# Patient Record
Sex: Female | Born: 1980 | Race: White | Hispanic: No | Smoking: Former smoker
Health system: Southern US, Community
[De-identification: ages and names within clinical notes are randomized; demographics above are authoritative.]

## PROBLEM LIST (undated history)

## (undated) DIAGNOSIS — F419 Anxiety disorder, unspecified: Secondary | ICD-10-CM

## (undated) DIAGNOSIS — I7300 Raynaud's syndrome without gangrene: Secondary | ICD-10-CM

## (undated) DIAGNOSIS — Z87898 Personal history of other specified conditions: Secondary | ICD-10-CM

## (undated) HISTORY — DX: Anxiety disorder, unspecified: F41.9

## (undated) HISTORY — DX: Raynaud's syndrome without gangrene: I73.00

## (undated) HISTORY — DX: Personal history of other specified conditions: Z87.898

---

## 2010-05-28 ENCOUNTER — Ambulatory Visit: Payer: Self-pay | Admitting: Family Medicine

## 2010-05-28 DIAGNOSIS — L049 Acute lymphadenitis, unspecified: Secondary | ICD-10-CM | POA: Insufficient documentation

## 2010-05-28 DIAGNOSIS — F39 Unspecified mood [affective] disorder: Secondary | ICD-10-CM | POA: Insufficient documentation

## 2010-05-29 LAB — CONVERTED CEMR LAB
Basophils Relative: 0.7 % (ref 0.0–3.0)
CO2: 27 meq/L (ref 19–32)
Chloride: 105 meq/L (ref 96–112)
Eosinophils Relative: 1.9 % (ref 0.0–5.0)
Folate: 7.5 ng/mL
Glucose, Bld: 74 mg/dL (ref 70–99)
Hemoglobin: 13.9 g/dL (ref 12.0–15.0)
Lymphocytes Relative: 26.7 % (ref 12.0–46.0)
MCV: 90.4 fL (ref 78.0–100.0)
Neutro Abs: 4.2 10*3/uL (ref 1.4–7.7)
Neutrophils Relative %: 63.7 % (ref 43.0–77.0)
RBC: 4.49 M/uL (ref 3.87–5.11)
Sodium: 139 meq/L (ref 135–145)
TSH: 1.8 microintl units/mL (ref 0.35–5.50)
WBC: 6.6 10*3/uL (ref 4.5–10.5)

## 2010-06-18 ENCOUNTER — Ambulatory Visit: Payer: Self-pay | Admitting: Family Medicine

## 2010-10-13 NOTE — Assessment & Plan Note (Signed)
Summary: NEW TO EST/KN   Vital Signs:  Patient profile:   30 year old female Height:      62.25 inches Weight:      144 pounds Pulse rate:   80 / minute Resp:     20 per minute BP sitting:   110 / 90  (left arm)  Vitals Entered By: Jeremy Johann CMA (May 28, 2010 10:17 AM) CC: NEW TO ESTABLISH, DISCUSS MOOD SWINGS, TENDERNESS UNDER LEFT ARM   History of Present Illness: 30 yo woman here today to establish care.  previous MD- Raymondo Band at [facility].  GYN- [facility]  1) mood swings- sxs started a few months ago.  will get really angy and then get depressed about her anger.  'will feel this way for a few weeks and then it goes away'.  pt describes herself as 'a typically shy person' but there will be times when she's 'super talkative, outgoing, really good mood'.  has hx of spending more than she has, drinking too much, previously promiscuious.  + family hx of 'manic depressive'- dad.  uncle w/ schizophrenia.  2) Tenderness under L arm- sxs started 'a couple of months ago'.  pain w/ pressing upper arm against chest wall.  armpit appears 'swollen' to pt when compared to R side.  no palpable lump, drainage, redness.  no pain w/ moving arms or PE activity.  pain w/ touching area.  Preventive Screening-Counseling & Management  Alcohol-Tobacco     Alcohol drinks/day: <1     Smoking Status: quit < 6 months     Year Quit: 2011     Pack years: 7  Caffeine-Diet-Exercise     Does Patient Exercise: yes      Sexual History:  currently monogamous.        Drug Use:  never and no.    Current Medications (verified): 1)  None  Allergies (verified): No Known Drug Allergies  Past History:  Past Medical History: abnormal pap  Past Surgical History: NONE  Family History: DM-FATHER HTN-MOTHER, FATHER Breast CA- none Colon CA- none  Social History: Married no children dog and cat Never Smoked Alcohol use-yes Drug use-no Regular exercise-yes Smoking Status:  quit < 6  months Drug Use:  never, no Does Patient Exercise:  yes Sexual History:  currently monogamous  Review of Systems      See HPI  Physical Exam  General:  Well-developed,well-nourished,in no acute distress; alert,appropriate and cooperative throughout examination Head:  Normocephalic and atraumatic without obvious abnormalities. No apparent alopecia or balding. Neck:  No deformities, masses, or tenderness noted. Lungs:  Normal respiratory effort, chest expands symmetrically. Lungs are clear to auscultation, no crackles or wheezes. Heart:  Normal rate and regular rhythm. S1 and S2 normal without gallop, murmur, click, rub or other extra sounds. Cervical Nodes:  No lymphadenopathy noted Axillary Nodes:  L axillary LN enlarged and L axillary LN tender.   Inguinal Nodes:  No significant adenopathy Psych:  Cognition and judgment appear intact. Alert and cooperative with normal attention span and concentration. No apparent delusions, illusions, hallucinations   Impression & Recommendations:  Problem # 1:  MOOD SWINGS (ICD-296.99) Assessment New ? bipolar disorder given pt's reported depression alternating w/ elevated mood.  will r/o thyroid disorder and electrolyte abnormality.  will refer pt to psych for complete evaluation and possible med initiation Orders: Venipuncture (16109) Specimen Handling (60454) TLB-TSH (Thyroid Stimulating Hormone) (84443-TSH) TLB-B12 + Folate Pnl (09811_91478-G95/AOZ) TLB-BMP (Basic Metabolic Panel-BMET) (80048-METABOL) Psychiatric Referral (Psych)  Problem # 2:  ACUTE LYMPHADENITIS (ICD-683) Assessment: New pt w/ tender, enlarged LN in L axilla.  no other LAD.  check CBC.  start Clinda.  if no improvement in a few weeks will [place] Orders: Specimen Handling (29562) TLB-CBC Platelet - w/Differential (85025-CBCD)  Her updated medication list for this problem includes:    Clindamycin Hcl 300 Mg Caps (Clindamycin hcl) .Marland Kitchen... 1 tab by mouth three times a day x7  days  Complete Medication List: 1)  Clindamycin Hcl 300 Mg Caps (Clindamycin hcl) .Marland Kitchen.. 1 tab by mouth three times a day x7 days  Patient Instructions: 1)  Please schedule a follow-up appointment in 3-4 weeks to follow up on inflamed lymph node and mood swings. 2)  We'll give you the names and numbers of psychiatrists to call to set up an appt 3)  Start the Clindamycin as directed for the lymph node 4)  Apply hot compresses 5)  We'll notify you of your lab results 6)  Call with any questions or concerns 7)  Welcome!  We're glad to have you!  Prescriptions: CLINDAMYCIN HCL 300 MG CAPS (CLINDAMYCIN HCL) 1 tab by mouth three times a day x7 days  #21 x 0   Entered and Authorized by:   Neena Rhymes MD   Signed by:   Neena Rhymes MD on 05/28/2010   Method used:   Electronically to        [facility] [phone]* (retail)       [address]       [place], [place]  [address]       Ph: [phone]       Fax: [phone]   RxID:   [id number]

## 2010-10-13 NOTE — Assessment & Plan Note (Signed)
Summary: FOLLOW UP LYMPH NODES/RH......Marland Kitchen   Vital Signs:  Patient profile:   30 year old female Height:      62.25 inches Weight:      143 pounds BMI:     26.04 Pulse rate:   60 / minute BP sitting:   104 / 60  (left arm)  Vitals Entered By: Doristine Devoid CMA (June 18, 2010 9:24 AM) CC: f/u on lymph nodes    History of Present Illness: 30 yo woman here today for f/u on  1) mood swings- has upcoming appt w/ psych for Monday.  will be seeing [facility].  still having alternating highs and lows, looking forward to upcoming appt.  2) LAD- feels area has improved but not resolved.  less painful.  no drainage, redness or warmth to area.  still localized to L axilla.  no fevers, chills.  Current Medications (verified): 1)  None  Allergies (verified): No Known Drug Allergies  Review of Systems      See HPI  Physical Exam  General:  Well-developed,well-nourished,in no acute distress; alert,appropriate and cooperative throughout examination Axillary Nodes:  no palpable LAD, non tender   Impression & Recommendations:  Problem # 1:  ACUTE LYMPHADENITIS (ICD-683) Assessment Improved pt's sxs have resolved- no palpable node on PE. The following medications were removed from the medication list:    Clindamycin Hcl 300 Mg Caps (Clindamycin hcl) .Marland Kitchen... 1 tab by mouth three times a day x7 days  Problem # 2:  MOOD SWINGS (ICD-296.99) Assessment: Unchanged pt has appt on Monday w/ psych.  will follow.  Patient Instructions: 1)  Schedule your physical at your convenience- do not eat before this appt 2)  Your lymph node is now normal! 3)  Good luck on Monday- keep me updated! 4)  Have a great weekend!

## 2011-03-10 ENCOUNTER — Ambulatory Visit (INDEPENDENT_AMBULATORY_CARE_PROVIDER_SITE_OTHER): Payer: Managed Care, Other (non HMO) | Admitting: Family Medicine

## 2011-03-10 ENCOUNTER — Encounter: Payer: Self-pay | Admitting: Family Medicine

## 2011-03-10 DIAGNOSIS — Z789 Other specified health status: Secondary | ICD-10-CM | POA: Insufficient documentation

## 2011-03-10 DIAGNOSIS — Z0184 Encounter for antibody response examination: Secondary | ICD-10-CM | POA: Insufficient documentation

## 2011-03-10 MED ORDER — CIPROFLOXACIN HCL 500 MG PO TABS: 500.0000 mg | ORAL_TABLET | Freq: Two times a day (BID) | ORAL | Status: AC

## 2011-03-10 NOTE — Progress Notes (Signed)
  Subjective:    Patient ID: Shelby Morris, female    DOB: 1981/01/09, 30 y.o.   MRN: [id number]  HPI Going to [place] and [place] for work.  Has gone previously and was told to bring cipro 'just in case'.  Has titers showing Hep B immunity- no evidence of Hep A.   Review of Systems For ROS see HPI     Objective:   Physical Exam  Constitutional: She is oriented to person, place, and time. She appears well-developed and well-nourished. No distress.  Neurological: She is alert and oriented to person, place, and time. No cranial nerve deficit.  Psychiatric: She has a normal mood and affect. Her behavior is normal. Judgment and thought content normal.          Assessment & Plan:

## 2011-03-10 NOTE — Patient Instructions (Signed)
HAVE A GREAT TRIP!!!! We'll call you about the Hep A status If you decide you want Malarone- call me! Hopefully you won't need the Cipro! Call with any questions or concerns Enjoy!

## 2011-03-11 ENCOUNTER — Ambulatory Visit (INDEPENDENT_AMBULATORY_CARE_PROVIDER_SITE_OTHER): Payer: Managed Care, Other (non HMO) | Admitting: *Deleted

## 2011-03-11 DIAGNOSIS — Z23 Encounter for immunization: Secondary | ICD-10-CM

## 2011-03-11 DIAGNOSIS — Z0000 Encounter for general adult medical examination without abnormal findings: Secondary | ICD-10-CM

## 2011-03-23 NOTE — Assessment & Plan Note (Signed)
Pt immune to Hep B but isn't sure about Hep A.  Check titers.  Vaccinate if negative.

## 2011-03-23 NOTE — Assessment & Plan Note (Signed)
Reviewed CDC recommendations for travel to [place].  Malaria prophylaxis is recommended if traveling to rural areas.  Pt [place] she will be in urban areas.  Doesn't feel malarone is necessary at this time but will discuss it w/ people at work and get back to me if she is interested.  Script for cipro given in case of traveller's diarrhea.

## 2011-05-11 ENCOUNTER — Ambulatory Visit (INDEPENDENT_AMBULATORY_CARE_PROVIDER_SITE_OTHER): Payer: Managed Care, Other (non HMO) | Admitting: Family Medicine

## 2011-05-11 ENCOUNTER — Encounter: Payer: Self-pay | Admitting: Family Medicine

## 2011-05-11 DIAGNOSIS — Z0000 Encounter for general adult medical examination without abnormal findings: Secondary | ICD-10-CM | POA: Insufficient documentation

## 2011-05-11 LAB — CBC WITH DIFFERENTIAL/PLATELET
Basophils Relative: 0.6 % (ref 0.0–3.0)
Eosinophils Relative: 2.6 % (ref 0.0–5.0)
HCT: 41.3 % (ref 36.0–46.0)
Hemoglobin: 13.8 g/dL (ref 12.0–15.0)
Lymphs Abs: 1.6 10*3/uL (ref 0.7–4.0)
MCHC: 33.4 g/dL (ref 30.0–36.0)
MCV: 90.4 fl (ref 78.0–100.0)
Monocytes Absolute: 0.4 10*3/uL (ref 0.1–1.0)
Neutro Abs: 4.9 10*3/uL (ref 1.4–7.7)
Neutrophils Relative %: 69.4 % (ref 43.0–77.0)
RBC: 4.57 Mil/uL (ref 3.87–5.11)
WBC: 7.1 10*3/uL (ref 4.5–10.5)

## 2011-05-11 LAB — BASIC METABOLIC PANEL
BUN: 12 mg/dL (ref 6–23)
Creatinine, Ser: 0.7 mg/dL (ref 0.4–1.2)
GFR: 113.73 mL/min (ref >60.00)
Glucose, Bld: 82 mg/dL (ref 70–99)
Potassium: 3.9 mEq/L (ref 3.5–5.1)

## 2011-05-11 LAB — HEPATIC FUNCTION PANEL
AST: 15 U/L (ref 0–37)
Albumin: 4.3 g/dL (ref 3.5–5.2)

## 2011-05-11 LAB — TSH: TSH: 2.2 u[IU]/mL (ref 0.35–5.50)

## 2011-05-11 LAB — LIPID PANEL
Cholesterol: 148 mg/dL (ref 0–200)
HDL: 80.9 mg/dL (ref >39.00)

## 2011-05-11 NOTE — Patient Instructions (Signed)
We'll notify you of your lab results Keep up the good work on diet and exercise- you look great! STOP SMOKING!  You can do it! Call with any questions or concerns Have fun camping!!!

## 2011-05-11 NOTE — Assessment & Plan Note (Signed)
Pt's PE WNL.  Check labs.  UTD on GYN.  Encouraged pt to stop smoking.  Anticipatory guidance provided.

## 2011-05-11 NOTE — Progress Notes (Signed)
  Subjective:    Patient ID: Shelby Morris, female    DOB: 1981/07/22, 30 y.o.   MRN: [id number]  HPI CPE- no concerns today.  GYN- Dr Achilles Dunk.  Has not had cholesterol.   Review of Systems Patient reports no vision/ hearing changes, adenopathy,fever, weight change,  persistant/recurrent hoarseness , swallowing issues, chest pain, palpitations, edema, persistant/recurrent cough, hemoptysis, dyspnea (rest/exertional/paroxysmal nocturnal), gastrointestinal bleeding (melena, rectal bleeding), abdominal pain, significant heartburn, bowel changes, GU symptoms (dysuria, hematuria, incontinence), Gyn symptoms (abnormal  bleeding, pain),  syncope, focal weakness, memory loss, numbness & tingling, skin/hair/nail changes, abnormal bruising or bleeding, anxiety, or depression.     Objective:   Physical Exam  General Appearance:    Alert, cooperative, no distress, appears stated age  Head:    Normocephalic, without obvious abnormality, atraumatic  Eyes:    PERRL, conjunctiva/corneas clear, EOM's intact, fundi    benign, both eyes  Ears:    Normal TM's and external ear canals, both ears  Nose:   Nares normal, septum midline, mucosa normal, no drainage    or sinus tenderness  Throat:   Lips, mucosa, and tongue normal; teeth and gums normal  Neck:   Supple, symmetrical, trachea midline, no adenopathy;    Thyroid: no enlargement/tenderness/nodules  Back:     Symmetric, no curvature, ROM normal, no CVA tenderness  Lungs:     Clear to auscultation bilaterally, respirations unlabored  Chest Wall:    No tenderness or deformity   Heart:    Regular rate and rhythm, S1 and S2 normal, no murmur, rub   or gallop  Breast Exam:    Deferred to GYN  Abdomen:     Soft, non-tender, bowel sounds active all four quadrants,    no masses, no organomegaly  Genitalia:    Deferred to GYN  Rectal:    Extremities:   Extremities normal, atraumatic, no cyanosis or edema  Pulses:   2+ and symmetric all extremities  Skin:    Skin color, texture, turgor normal, no rashes or lesions  Lymph nodes:   Cervical, supraclavicular, and axillary nodes normal  Neurologic:   CNII-XII intact, normal strength, sensation and reflexes    throughout          Assessment & Plan:

## 2011-05-12 ENCOUNTER — Encounter: Payer: Self-pay | Admitting: *Deleted

## 2011-05-13 ENCOUNTER — Telehealth: Payer: Self-pay

## 2011-05-13 LAB — VITAMIN D 1,25 DIHYDROXY
Vitamin D 1, 25 (OH)2 Total: 66 pg/mL (ref 18–72)
Vitamin D3 1, 25 (OH)2: 66 pg/mL

## 2011-05-13 NOTE — Telephone Encounter (Signed)
Lab mailed

## 2011-05-13 NOTE — Telephone Encounter (Signed)
Message copied by Beverely Low on Thu May 13, 2011  2:48 PM ------      Message from: Sheliah Hatch      Created: Thu May 13, 2011  9:35 AM       Vit D normal

## 2011-12-13 ENCOUNTER — Ambulatory Visit: Payer: Managed Care, Other (non HMO)

## 2011-12-21 ENCOUNTER — Ambulatory Visit: Payer: Managed Care, Other (non HMO)

## 2011-12-28 ENCOUNTER — Ambulatory Visit (INDEPENDENT_AMBULATORY_CARE_PROVIDER_SITE_OTHER): Payer: Managed Care, Other (non HMO) | Admitting: *Deleted

## 2011-12-28 DIAGNOSIS — Z23 Encounter for immunization: Secondary | ICD-10-CM

## 2011-12-28 MED ORDER — TETANUS-DIPHTH-ACELL PERTUSSIS 5-2.5-18.5 LF-MCG/0.5 IM SUSP
0.5000 mL | Freq: Once | INTRAMUSCULAR | Status: AC
  Administered 2011-12-28: 0.5 mL via INTRAMUSCULAR

## 2012-10-12 ENCOUNTER — Encounter: Payer: Self-pay | Admitting: Family Medicine

## 2012-10-12 ENCOUNTER — Ambulatory Visit (INDEPENDENT_AMBULATORY_CARE_PROVIDER_SITE_OTHER): Payer: Managed Care, Other (non HMO) | Admitting: Family Medicine

## 2012-10-12 VITALS — BP 110/70 | HR 87 | Temp 98.0°F | Ht 63.0 in | Wt 160.6 lb

## 2012-10-12 DIAGNOSIS — F17200 Nicotine dependence, unspecified, uncomplicated: Secondary | ICD-10-CM

## 2012-10-12 DIAGNOSIS — Z720 Tobacco use: Secondary | ICD-10-CM | POA: Insufficient documentation

## 2012-10-12 MED ORDER — VARENICLINE TARTRATE 1 MG PO TABS: 1.0000 mg | ORAL_TABLET | Freq: Two times a day (BID) | ORAL | Status: DC

## 2012-10-12 MED ORDER — VARENICLINE TARTRATE 0.5 MG X 11 & 1 MG X 42 PO MISC: ORAL | Status: DC

## 2012-10-12 NOTE — Progress Notes (Signed)
  Subjective:    Patient ID: Shelby Morris, female    DOB: 01/19/1981, 32 y.o.   MRN: [id number]  HPI Tobacco use- currently smoking 10-15 cigarettes daily.  Smoking x10 yrs.  Wants to quit.  Husband is also a smoker.  Husband is willing to quit.  Has quit previously by going cold [place] but this only lasted 1 month.  Has used the gum and has attempted to wean down.  Tried E cigs w/out results.  Has multiple friends who have quit using Chantix.  No current depression/anxiety.   Review of Systems For ROS see HPI     Objective:   Physical Exam  Vitals reviewed. Constitutional: She is oriented to person, place, and time. She appears well-developed and well-nourished. No distress.  Neurological: She is alert and oriented to person, place, and time. No cranial nerve deficit. Coordination normal.  Psychiatric: She has a normal mood and affect. Her behavior is normal. Judgment and thought content normal.          Assessment & Plan:

## 2012-10-12 NOTE — Assessment & Plan Note (Signed)
New.  Pt now interested in quitting.  Will start Chantix.  Reviewed appropriate start date and cessation techniques.  Will follow.

## 2012-10-12 NOTE — Patient Instructions (Addendum)
Pick a quit date and start the Chantix 1 week prior Use the starter pack first then use the continuation packs Call with any questions or concerns You can totally do this!

## 2012-10-16 ENCOUNTER — Telehealth: Payer: Self-pay | Admitting: *Deleted

## 2012-10-16 NOTE — Telephone Encounter (Signed)
Spoke with Pt need to call to see if med can be covered. [phone] [id number]

## 2012-10-16 NOTE — Telephone Encounter (Signed)
Pt left VM stating that we need to contact her insurance so they can cover med. Left message to call office.

## 2012-11-01 NOTE — Telephone Encounter (Signed)
Form has been requested awaiting fax.

## 2012-11-14 NOTE — Telephone Encounter (Signed)
Prior Auth denied for the following reason. Your prescription calls for a medication that is excluded from coverage based on the terms of your benefit plan.  Coverage of the requested drug is contractually excluded from the [facility] pharmacy benefit and therefore we are unable to authorize coverage of the requested drug.  This denial decision was based upon the Exclusions and limitations described in the certificate of coverage.

## 2012-11-14 NOTE — Telephone Encounter (Signed)
If chantix is declined by insurance, the next option is wellbutrin.  Please ask pt if she is interested in trying this

## 2012-11-15 NOTE — Telephone Encounter (Signed)
Spoke with Pt who states that she actually paid for the first month out of pocket with discount card. Pt notes that she has stopped smoking so far so she does not need the Wellbutrin at this time, but will call back if in the future she needs med.

## 2013-01-29 ENCOUNTER — Ambulatory Visit (INDEPENDENT_AMBULATORY_CARE_PROVIDER_SITE_OTHER): Payer: Managed Care, Other (non HMO) | Admitting: Family Medicine

## 2013-01-29 ENCOUNTER — Encounter: Payer: Self-pay | Admitting: Family Medicine

## 2013-01-29 ENCOUNTER — Ambulatory Visit (HOSPITAL_BASED_OUTPATIENT_CLINIC_OR_DEPARTMENT_OTHER)
Admission: RE | Admit: 2013-01-29 | Discharge: 2013-01-29 | Disposition: A | Discharge status: Home / Self Care | Payer: Managed Care, Other (non HMO) | Source: Ambulatory Visit | Attending: Family Medicine | Admitting: Family Medicine

## 2013-01-29 VITALS — BP 110/70 | HR 74 | Temp 98.6°F | Ht 63.0 in | Wt 150.6 lb

## 2013-01-29 DIAGNOSIS — M25579 Pain in unspecified ankle and joints of unspecified foot: Secondary | ICD-10-CM | POA: Insufficient documentation

## 2013-01-29 DIAGNOSIS — M25571 Pain in right ankle and joints of right foot: Secondary | ICD-10-CM

## 2013-01-29 DIAGNOSIS — X58XXXA Exposure to other specified factors, initial encounter: Secondary | ICD-10-CM | POA: Insufficient documentation

## 2013-01-29 DIAGNOSIS — M25473 Effusion, unspecified ankle: Secondary | ICD-10-CM | POA: Insufficient documentation

## 2013-01-29 DIAGNOSIS — M25476 Effusion, unspecified foot: Secondary | ICD-10-CM | POA: Insufficient documentation

## 2013-01-29 MED ORDER — NAPROXEN 500 MG PO TABS: 500.0000 mg | ORAL_TABLET | Freq: Two times a day (BID) | ORAL | Status: AC

## 2013-01-29 NOTE — Assessment & Plan Note (Signed)
New.  Pt w/ impressive swelling and bruising of R ankle.  + TTP over medial malleolus.  Pain w/ weight bearing.  Will get Xray.  Pt placed in ASO.  Start scheduled NSAIDs.  If fx, refer to ortho.  Reviewed soft tissue damage and that it may take 6-8 weeks to heal from a severe sprain.  Will follow closely.  Pt expressed understanding and is in agreement w/ plan.

## 2013-01-29 NOTE — Patient Instructions (Signed)
Go to the [facility] on [address] and get your [employer] call you with your results Continue to ICE! Start the Naproxen twice daily- take w/ food Call with any questions or concerns Hang in there!

## 2013-01-29 NOTE — Progress Notes (Signed)
  Subjective:    Patient ID: Shelby Morris, female    DOB: March 02, 1981, 32 y.o.   MRN: [id number]  HPI Ankle pain- R ankle, rolled when going up the stairs.  Pt thinks she might have inverted ankle but 'it happened so quickly it's hard to tell'.  Swollen, mild bruising.  Able to put some pressure on it today, was unable yesterday.  Has been icing and elevating, wrapped w/ ACE.     Review of Systems For ROS see HPI     Objective:   Physical Exam  Vitals reviewed. Constitutional: She is oriented to person, place, and time. She appears well-developed and well-nourished. No distress.  Cardiovascular: Intact distal pulses.   Musculoskeletal:       Right ankle: She exhibits decreased range of motion (pain w/ dorsiflexion > plantar flexion), swelling and ecchymosis. She exhibits normal pulse. Tenderness. Medial malleolus tenderness found. No AITFL, no CF ligament, no posterior TFL, no head of 5th metatarsal and no proximal fibula tenderness found.  Neurological: She is alert and oriented to person, place, and time.  Skin: Skin is warm and dry.  Bruising of R ankle          Assessment & Plan:

## 2013-09-10 ENCOUNTER — Telehealth: Payer: Self-pay | Admitting: *Deleted

## 2013-09-10 MED ORDER — CIPROFLOXACIN HCL 500 MG PO TABS: 500.0000 mg | ORAL_TABLET | Freq: Two times a day (BID) | ORAL | Status: DC

## 2013-09-10 NOTE — Telephone Encounter (Signed)
Patient called and stated that dr Beverely Low wrote her a generic antibiotic for traveling oversees and wanted to see could she get it again without being seen.    Pharmacy [facility] [address] [place]

## 2013-09-10 NOTE — Telephone Encounter (Signed)
Ok for Cipro 500mg  BID, #20, no refills

## 2013-09-10 NOTE — Telephone Encounter (Signed)
Med filled and pt notified.  

## 2018-04-03 DIAGNOSIS — R140 Abdominal distension (gaseous): Secondary | ICD-10-CM | POA: Diagnosis not present

## 2018-04-03 DIAGNOSIS — E559 Vitamin D deficiency, unspecified: Secondary | ICD-10-CM | POA: Diagnosis not present

## 2018-04-03 DIAGNOSIS — R5383 Other fatigue: Secondary | ICD-10-CM | POA: Diagnosis not present

## 2018-04-03 DIAGNOSIS — F419 Anxiety disorder, unspecified: Secondary | ICD-10-CM | POA: Diagnosis not present

## 2018-04-03 DIAGNOSIS — R197 Diarrhea, unspecified: Secondary | ICD-10-CM | POA: Diagnosis not present

## 2018-04-17 DIAGNOSIS — R197 Diarrhea, unspecified: Secondary | ICD-10-CM | POA: Diagnosis not present

## 2018-04-17 DIAGNOSIS — R5383 Other fatigue: Secondary | ICD-10-CM | POA: Diagnosis not present

## 2018-04-17 DIAGNOSIS — R140 Abdominal distension (gaseous): Secondary | ICD-10-CM | POA: Diagnosis not present

## 2018-04-17 DIAGNOSIS — E538 Deficiency of other specified B group vitamins: Secondary | ICD-10-CM | POA: Diagnosis not present

## 2019-03-12 ENCOUNTER — Other Ambulatory Visit: Payer: Self-pay

## 2019-03-12 ENCOUNTER — Encounter: Payer: Self-pay | Admitting: *Deleted

## 2019-03-12 ENCOUNTER — Emergency Department
Admission: EM | Admit: 2019-03-12 | Discharge: 2019-03-12 | Disposition: A | Discharge status: Home / Self Care | Payer: Self-pay | Source: Home / Self Care | Attending: Family Medicine | Admitting: Family Medicine

## 2019-03-12 DIAGNOSIS — J069 Acute upper respiratory infection, unspecified: Secondary | ICD-10-CM

## 2019-03-12 DIAGNOSIS — J029 Acute pharyngitis, unspecified: Secondary | ICD-10-CM

## 2019-03-12 NOTE — ED Triage Notes (Signed)
Pt c/o sore throat and HA x last night. Denies fever, cough or SOB. Denies known COVID exposure. Reports 38yo daughter has a runny nose.

## 2019-03-12 NOTE — ED Provider Notes (Signed)
[facility]    CSN: [id number] Arrival date & time: 03/12/19  1020     History   Chief Complaint Chief Complaint  Patient presents with  . Sore Throat    HPI Shelby Morris is a 38 y.o. female.   Patient developed a mild sore throat last night, and awoke today with persistent sore throat and nasal congestion.  She denies change in taste/smell.  She denies cough, myalgias, and fevers, chills, and sweats.  She has seasonal rhinitis, not normally active this time of year. Her 77 year old daughter has just developed a stuffy nose.  The history is provided by the patient.    Past Medical History:  Diagnosis Date  . History of abnormal Pap smear     Patient Active Problem List   Diagnosis Date Noted  . Ankle pain 01/29/2013  . Tobacco use 10/12/2012  . General medical examination 05/11/2011  . Immunity status testing 03/10/2011  . Foreign travel 03/10/2011  . MOOD SWINGS 05/28/2010  . ACUTE LYMPHADENITIS 05/28/2010    History reviewed. No pertinent surgical history.  OB History   No obstetric history on file.      Home Medications    Prior to Admission medications   Medication Sig Start Date End Date Taking? Authorizing Provider  citalopram (CELEXA) 20 MG tablet Take 20 mg by mouth daily.   Yes [provider]    Family History Family History  Problem Relation Age of Onset  . Diabetes Father   . Hypertension Father   . Hypertension Mother     Social History Social History   Tobacco Use  . Smoking status: Former Smoker    Packs/day: 0.50    Quit date: 10/01/2012    Years since quitting: 6.4  . Smokeless tobacco: Never Used  Substance Use Topics  . Alcohol use: No  . Drug use: No     Allergies   Flagyl [metronidazole]   Review of Systems Review of Systems + sore throat No cough No pleuritic pain No wheezing + nasal congestion ? post-nasal drainage No sinus pain/pressure No itchy/red eyes No earache No hemoptysis  No SOB No fever/chills No nausea No vomiting No abdominal pain No diarrhea No urinary symptoms No skin rash + fatigue No myalgias + headache    Physical Exam Triage Vital Signs ED Triage Vitals  Enc Vitals Group     BP 03/12/19 1048 (!) 130/91     Pulse Rate 03/12/19 1048 90     Resp 03/12/19 1048 16     Temp 03/12/19 1048 98.4 F (36.9 C)     Temp Source 03/12/19 1048 Oral     SpO2 03/12/19 1048 100 %     Weight 03/12/19 1049 160 lb (72.6 kg)     Height 03/12/19 1049 5\' 2"  (1.575 m)     Head Circumference --      Peak Flow --      Pain Score 03/12/19 1049 0     Pain Loc --      Pain Edu? --      Excl. in [place]? --    No data found.  Updated Vital Signs BP (!) 130/91 (BP Location: Right Arm)   Pulse 90   Temp 98.4 F (36.9 C) (Oral)   Resp 16   Ht 5\' 2"  (1.575 m)   Wt 72.6 kg   LMP 03/05/2019   SpO2 100%   BMI 29.26 kg/m   Visual Acuity Right Eye Distance:  Left Eye Distance:   Bilateral Distance:    Right Eye Near:   Left Eye Near:    Bilateral Near:     Physical Exam Nursing notes and Vital Signs reviewed. Appearance:  Patient appears stated age, and in no acute distress Eyes:  Pupils are equal, round, and reactive to light and accomodation.  Extraocular movement is intact.  Conjunctivae are not inflamed  Ears:  Canals normal.  Tympanic membranes normal.  Nose:  Mildly congested turbinates.  No sinus tenderness.  Pharynx:  Minimal erythema Neck:  Supple.  Enlarged posterior/lateral nodes are palpated bilaterally, tender to palpation on the left.   Lungs:  Clear to auscultation.  Breath sounds are equal.  Moving air well. Heart:  Regular rate and rhythm without murmurs, rubs, or gallops.  Abdomen:  Nontender without masses or hepatosplenomegaly.  Bowel sounds are present.  No CVA or flank tenderness.  Extremities:  No edema.  Skin:  No rash present.    UC Treatments / Results  Labs (all labs ordered are listed, but only abnormal results are  displayed) Labs Reviewed  STREP A DNA PROBE  POCT RAPID STREP A (OFFICE) negative    EKG None  Radiology No results found.  Procedures Procedures (including critical care time)  Medications Ordered in UC Medications - No data to display  Initial Impression / Assessment and Plan / UC Course  I have reviewed the triage vital signs and the nursing notes.  Pertinent labs & imaging results that were available during my care of the patient were reviewed by me and considered in my medical decision making (see chart for details).    Throat culture pending. Suspect early viral URI.  There is no evidence of bacterial infection today.   Treat symptomatically for now  Followup with Family Doctor if not improved in about 10 days.   Final Clinical Impressions(s) / UC Diagnoses   Final diagnoses:  Acute pharyngitis, unspecified etiology  Viral URI     Discharge Instructions     Take plain guaifenesin (1200mg  extended release tabs such as Mucinex) twice daily, with plenty of water, for cough and congestion.  May add Pseudoephedrine (30mg , one or two every 4 to 6 hours) for sinus congestion.  Get adequate rest.   May use Afrin nasal spray (or generic oxymetazoline) each morning for about 5 days and then discontinue.  Also recommend using saline nasal spray several times daily and saline nasal irrigation (AYR is a common brand).  Use Flonase nasal spray each morning after using Afrin nasal spray and saline nasal irrigation. Try warm salt water gargles for sore throat.  Stop all antihistamines for now, and other non-prescription cough/cold preparations. May take Ibuprofen 200mg , 4 tabs every 8 hours with food for sore throat, headache, etc. May take Delsym Cough Suppressant at bedtime for nighttime cough.   If symptoms become significantly worse during the night or over the weekend, proceed to the local emergency room.     ED Prescriptions    None         Lattie HawBeese, Tawan Corkern A, MD  03/12/19 1118

## 2019-03-12 NOTE — Discharge Instructions (Addendum)
Take plain guaifenesin (1200mg  extended release tabs such as Mucinex) twice daily, with plenty of water, for cough and congestion.  May add Pseudoephedrine (30mg , one or two every 4 to 6 hours) for sinus congestion.  Get adequate rest.   May use Afrin nasal spray (or generic oxymetazoline) each morning for about 5 days and then discontinue.  Also recommend using saline nasal spray several times daily and saline nasal irrigation (AYR is a common brand).  Use Flonase nasal spray each morning after using Afrin nasal spray and saline nasal irrigation. Try warm salt water gargles for sore throat.  Stop all antihistamines for now, and other non-prescription cough/cold preparations. May take Ibuprofen 200mg , 4 tabs every 8 hours with food for sore throat, headache, etc. May take Delsym Cough Suppressant at bedtime for nighttime cough.   If symptoms become significantly worse during the night or over the weekend, proceed to the local emergency room.

## 2019-03-13 ENCOUNTER — Telehealth: Payer: Self-pay

## 2019-03-13 LAB — STREP A DNA PROBE: Group A Strep Probe: NOT DETECTED

## 2019-03-13 NOTE — Telephone Encounter (Signed)
Pt states she is feeling better since UC visit. Taking meds as prescribed. Given neg TCX results.

## 2019-09-27 DIAGNOSIS — F411 Generalized anxiety disorder: Secondary | ICD-10-CM | POA: Diagnosis not present

## 2019-10-08 DIAGNOSIS — F901 Attention-deficit hyperactivity disorder, predominantly hyperactive type: Secondary | ICD-10-CM | POA: Diagnosis not present

## 2019-10-08 DIAGNOSIS — F411 Generalized anxiety disorder: Secondary | ICD-10-CM | POA: Diagnosis not present

## 2019-10-08 DIAGNOSIS — F1010 Alcohol abuse, uncomplicated: Secondary | ICD-10-CM | POA: Diagnosis not present

## 2019-10-22 DIAGNOSIS — F1010 Alcohol abuse, uncomplicated: Secondary | ICD-10-CM | POA: Diagnosis not present

## 2019-10-22 DIAGNOSIS — F901 Attention-deficit hyperactivity disorder, predominantly hyperactive type: Secondary | ICD-10-CM | POA: Diagnosis not present

## 2019-10-22 DIAGNOSIS — F411 Generalized anxiety disorder: Secondary | ICD-10-CM | POA: Diagnosis not present

## 2019-11-05 DIAGNOSIS — F901 Attention-deficit hyperactivity disorder, predominantly hyperactive type: Secondary | ICD-10-CM | POA: Diagnosis not present

## 2019-11-05 DIAGNOSIS — F411 Generalized anxiety disorder: Secondary | ICD-10-CM | POA: Diagnosis not present

## 2019-11-05 DIAGNOSIS — F1010 Alcohol abuse, uncomplicated: Secondary | ICD-10-CM | POA: Diagnosis not present

## 2019-11-19 DIAGNOSIS — F1010 Alcohol abuse, uncomplicated: Secondary | ICD-10-CM | POA: Diagnosis not present

## 2019-11-19 DIAGNOSIS — F901 Attention-deficit hyperactivity disorder, predominantly hyperactive type: Secondary | ICD-10-CM | POA: Diagnosis not present

## 2019-11-19 DIAGNOSIS — F411 Generalized anxiety disorder: Secondary | ICD-10-CM | POA: Diagnosis not present

## 2019-11-30 DIAGNOSIS — Z23 Encounter for immunization: Secondary | ICD-10-CM | POA: Diagnosis not present

## 2019-12-13 DIAGNOSIS — G44219 Episodic tension-type headache, not intractable: Secondary | ICD-10-CM | POA: Diagnosis not present

## 2019-12-13 DIAGNOSIS — R5383 Other fatigue: Secondary | ICD-10-CM | POA: Diagnosis not present

## 2019-12-13 DIAGNOSIS — F411 Generalized anxiety disorder: Secondary | ICD-10-CM | POA: Diagnosis not present

## 2019-12-13 DIAGNOSIS — Z0000 Encounter for general adult medical examination without abnormal findings: Secondary | ICD-10-CM | POA: Diagnosis not present

## 2019-12-13 DIAGNOSIS — Z13220 Encounter for screening for lipoid disorders: Secondary | ICD-10-CM | POA: Diagnosis not present

## 2019-12-13 DIAGNOSIS — E559 Vitamin D deficiency, unspecified: Secondary | ICD-10-CM | POA: Diagnosis not present

## 2019-12-13 LAB — CBC AND DIFFERENTIAL
HCT: 38 (ref 36–46)
Hemoglobin: 13 (ref 12.0–16.0)
Platelets: 267 10*3/uL (ref 150–400)
WBC: 7.7

## 2019-12-13 LAB — BASIC METABOLIC PANEL
BUN: 17 (ref 4–21)
CO2: 28 — AB (ref 13–22)
Chloride: 103 (ref 99–108)
Creatinine: 0.8 (ref 0.5–1.1)
Glucose: 90
Potassium: 4.1 meq/L (ref 3.5–5.1)
Sodium: 138 (ref 137–147)

## 2019-12-13 LAB — LIPID PANEL
Cholesterol: 163 (ref 0–200)
HDL: 48 (ref 35–70)
LDL Cholesterol: 90
LDl/HDL Ratio: 3.4
Triglycerides: 145 (ref 40–160)

## 2019-12-13 LAB — CBC: RBC: 4.22 (ref 3.87–5.11)

## 2019-12-13 LAB — HEPATIC FUNCTION PANEL
ALT: 14 U/L (ref 7–35)
AST: 14 (ref 13–35)
Alkaline Phosphatase: 40 (ref 25–125)
Bilirubin, Total: 0.2

## 2019-12-13 LAB — COMPREHENSIVE METABOLIC PANEL
Albumin: 4.2 (ref 3.5–5.0)
Calcium: 9.1 (ref 8.7–10.7)
eGFR: 76

## 2019-12-13 LAB — TSH: TSH: 2.48 (ref 0.41–5.90)

## 2019-12-13 LAB — VITAMIN D 25 HYDROXY (VIT D DEFICIENCY, FRACTURES): Vit D, 25-Hydroxy: 26

## 2019-12-17 ENCOUNTER — Other Ambulatory Visit (HOSPITAL_COMMUNITY)
Admission: RE | Admit: 2019-12-17 | Discharge: 2019-12-17 | Disposition: A | Discharge status: Home / Self Care | Payer: BC Managed Care – PPO | Source: Ambulatory Visit | Attending: Physician Assistant | Admitting: Physician Assistant

## 2019-12-17 ENCOUNTER — Other Ambulatory Visit: Payer: Self-pay | Admitting: Physician Assistant

## 2019-12-17 DIAGNOSIS — Z124 Encounter for screening for malignant neoplasm of cervix: Secondary | ICD-10-CM | POA: Diagnosis not present

## 2019-12-17 LAB — RESULTS CONSOLE HPV: CHL HPV: POSITIVE

## 2019-12-17 LAB — HM PAP SMEAR: HM Pap smear: ABNORMAL

## 2019-12-21 LAB — CYTOLOGY - PAP
Chlamydia: NEGATIVE
Comment: NEGATIVE
Comment: NEGATIVE
Comment: NEGATIVE
Comment: NEGATIVE
Comment: NORMAL
Diagnosis: UNDETERMINED — AB
HPV 16: NEGATIVE
HPV 18 / 45: NEGATIVE
High risk HPV: POSITIVE — AB
Neisseria Gonorrhea: NEGATIVE
Trichomonas: NEGATIVE

## 2019-12-28 DIAGNOSIS — Z23 Encounter for immunization: Secondary | ICD-10-CM | POA: Diagnosis not present

## 2020-01-10 DIAGNOSIS — N72 Inflammatory disease of cervix uteri: Secondary | ICD-10-CM | POA: Diagnosis not present

## 2020-01-10 DIAGNOSIS — Z30432 Encounter for removal of intrauterine contraceptive device: Secondary | ICD-10-CM | POA: Diagnosis not present

## 2020-01-10 DIAGNOSIS — N898 Other specified noninflammatory disorders of vagina: Secondary | ICD-10-CM | POA: Diagnosis not present

## 2020-01-10 DIAGNOSIS — Z3202 Encounter for pregnancy test, result negative: Secondary | ICD-10-CM | POA: Diagnosis not present

## 2020-01-21 DIAGNOSIS — Z3202 Encounter for pregnancy test, result negative: Secondary | ICD-10-CM | POA: Diagnosis not present

## 2020-01-21 DIAGNOSIS — Z308 Encounter for other contraceptive management: Secondary | ICD-10-CM | POA: Diagnosis not present

## 2020-02-04 DIAGNOSIS — F411 Generalized anxiety disorder: Secondary | ICD-10-CM | POA: Diagnosis not present

## 2020-02-04 DIAGNOSIS — F902 Attention-deficit hyperactivity disorder, combined type: Secondary | ICD-10-CM | POA: Diagnosis not present

## 2020-02-22 DIAGNOSIS — F411 Generalized anxiety disorder: Secondary | ICD-10-CM | POA: Diagnosis not present

## 2020-02-22 DIAGNOSIS — F902 Attention-deficit hyperactivity disorder, combined type: Secondary | ICD-10-CM | POA: Diagnosis not present

## 2020-02-28 DIAGNOSIS — F902 Attention-deficit hyperactivity disorder, combined type: Secondary | ICD-10-CM | POA: Diagnosis not present

## 2020-02-28 DIAGNOSIS — F329 Major depressive disorder, single episode, unspecified: Secondary | ICD-10-CM | POA: Diagnosis not present

## 2020-02-28 DIAGNOSIS — F411 Generalized anxiety disorder: Secondary | ICD-10-CM | POA: Diagnosis not present

## 2020-02-28 DIAGNOSIS — F4310 Post-traumatic stress disorder, unspecified: Secondary | ICD-10-CM | POA: Diagnosis not present

## 2020-03-07 DIAGNOSIS — F4310 Post-traumatic stress disorder, unspecified: Secondary | ICD-10-CM | POA: Diagnosis not present

## 2020-03-07 DIAGNOSIS — F411 Generalized anxiety disorder: Secondary | ICD-10-CM | POA: Diagnosis not present

## 2020-03-07 DIAGNOSIS — F329 Major depressive disorder, single episode, unspecified: Secondary | ICD-10-CM | POA: Diagnosis not present

## 2020-03-07 DIAGNOSIS — F902 Attention-deficit hyperactivity disorder, combined type: Secondary | ICD-10-CM | POA: Diagnosis not present

## 2020-03-12 DIAGNOSIS — D1801 Hemangioma of skin and subcutaneous tissue: Secondary | ICD-10-CM | POA: Diagnosis not present

## 2020-03-12 DIAGNOSIS — L821 Other seborrheic keratosis: Secondary | ICD-10-CM | POA: Diagnosis not present

## 2020-03-12 DIAGNOSIS — D229 Melanocytic nevi, unspecified: Secondary | ICD-10-CM | POA: Diagnosis not present

## 2020-03-21 DIAGNOSIS — F902 Attention-deficit hyperactivity disorder, combined type: Secondary | ICD-10-CM | POA: Diagnosis not present

## 2020-03-21 DIAGNOSIS — F4310 Post-traumatic stress disorder, unspecified: Secondary | ICD-10-CM | POA: Diagnosis not present

## 2020-03-21 DIAGNOSIS — F1010 Alcohol abuse, uncomplicated: Secondary | ICD-10-CM | POA: Diagnosis not present

## 2020-03-21 DIAGNOSIS — F411 Generalized anxiety disorder: Secondary | ICD-10-CM | POA: Diagnosis not present

## 2020-04-04 DIAGNOSIS — F411 Generalized anxiety disorder: Secondary | ICD-10-CM | POA: Diagnosis not present

## 2020-04-04 DIAGNOSIS — F902 Attention-deficit hyperactivity disorder, combined type: Secondary | ICD-10-CM | POA: Diagnosis not present

## 2020-04-21 DIAGNOSIS — F902 Attention-deficit hyperactivity disorder, combined type: Secondary | ICD-10-CM | POA: Diagnosis not present

## 2020-04-21 DIAGNOSIS — F411 Generalized anxiety disorder: Secondary | ICD-10-CM | POA: Diagnosis not present

## 2020-05-02 DIAGNOSIS — F411 Generalized anxiety disorder: Secondary | ICD-10-CM | POA: Diagnosis not present

## 2020-05-02 DIAGNOSIS — F902 Attention-deficit hyperactivity disorder, combined type: Secondary | ICD-10-CM | POA: Diagnosis not present

## 2020-05-30 DIAGNOSIS — F411 Generalized anxiety disorder: Secondary | ICD-10-CM | POA: Diagnosis not present

## 2020-05-30 DIAGNOSIS — F902 Attention-deficit hyperactivity disorder, combined type: Secondary | ICD-10-CM | POA: Diagnosis not present

## 2020-06-24 DIAGNOSIS — F411 Generalized anxiety disorder: Secondary | ICD-10-CM | POA: Diagnosis not present

## 2020-06-24 DIAGNOSIS — F902 Attention-deficit hyperactivity disorder, combined type: Secondary | ICD-10-CM | POA: Diagnosis not present

## 2020-07-07 DIAGNOSIS — M9902 Segmental and somatic dysfunction of thoracic region: Secondary | ICD-10-CM | POA: Diagnosis not present

## 2020-07-07 DIAGNOSIS — M9905 Segmental and somatic dysfunction of pelvic region: Secondary | ICD-10-CM | POA: Diagnosis not present

## 2020-07-07 DIAGNOSIS — M9903 Segmental and somatic dysfunction of lumbar region: Secondary | ICD-10-CM | POA: Diagnosis not present

## 2020-07-07 DIAGNOSIS — M9901 Segmental and somatic dysfunction of cervical region: Secondary | ICD-10-CM | POA: Diagnosis not present

## 2020-07-11 DIAGNOSIS — F902 Attention-deficit hyperactivity disorder, combined type: Secondary | ICD-10-CM | POA: Diagnosis not present

## 2020-07-11 DIAGNOSIS — F411 Generalized anxiety disorder: Secondary | ICD-10-CM | POA: Diagnosis not present

## 2020-07-14 DIAGNOSIS — M9901 Segmental and somatic dysfunction of cervical region: Secondary | ICD-10-CM | POA: Diagnosis not present

## 2020-07-14 DIAGNOSIS — M9902 Segmental and somatic dysfunction of thoracic region: Secondary | ICD-10-CM | POA: Diagnosis not present

## 2020-07-14 DIAGNOSIS — M9905 Segmental and somatic dysfunction of pelvic region: Secondary | ICD-10-CM | POA: Diagnosis not present

## 2020-07-14 DIAGNOSIS — M9903 Segmental and somatic dysfunction of lumbar region: Secondary | ICD-10-CM | POA: Diagnosis not present

## 2020-07-21 DIAGNOSIS — F329 Major depressive disorder, single episode, unspecified: Secondary | ICD-10-CM | POA: Diagnosis not present

## 2020-07-21 DIAGNOSIS — F902 Attention-deficit hyperactivity disorder, combined type: Secondary | ICD-10-CM | POA: Diagnosis not present

## 2020-07-21 DIAGNOSIS — F411 Generalized anxiety disorder: Secondary | ICD-10-CM | POA: Diagnosis not present

## 2020-07-21 DIAGNOSIS — F4310 Post-traumatic stress disorder, unspecified: Secondary | ICD-10-CM | POA: Diagnosis not present

## 2020-07-23 DIAGNOSIS — M9902 Segmental and somatic dysfunction of thoracic region: Secondary | ICD-10-CM | POA: Diagnosis not present

## 2020-07-23 DIAGNOSIS — M9901 Segmental and somatic dysfunction of cervical region: Secondary | ICD-10-CM | POA: Diagnosis not present

## 2020-07-23 DIAGNOSIS — M9905 Segmental and somatic dysfunction of pelvic region: Secondary | ICD-10-CM | POA: Diagnosis not present

## 2020-07-23 DIAGNOSIS — M9903 Segmental and somatic dysfunction of lumbar region: Secondary | ICD-10-CM | POA: Diagnosis not present

## 2020-07-30 DIAGNOSIS — M9902 Segmental and somatic dysfunction of thoracic region: Secondary | ICD-10-CM | POA: Diagnosis not present

## 2020-07-30 DIAGNOSIS — M9905 Segmental and somatic dysfunction of pelvic region: Secondary | ICD-10-CM | POA: Diagnosis not present

## 2020-07-30 DIAGNOSIS — M9903 Segmental and somatic dysfunction of lumbar region: Secondary | ICD-10-CM | POA: Diagnosis not present

## 2020-07-30 DIAGNOSIS — M9901 Segmental and somatic dysfunction of cervical region: Secondary | ICD-10-CM | POA: Diagnosis not present

## 2020-08-01 DIAGNOSIS — F411 Generalized anxiety disorder: Secondary | ICD-10-CM | POA: Diagnosis not present

## 2020-08-01 DIAGNOSIS — F4310 Post-traumatic stress disorder, unspecified: Secondary | ICD-10-CM | POA: Diagnosis not present

## 2020-08-06 DIAGNOSIS — M9905 Segmental and somatic dysfunction of pelvic region: Secondary | ICD-10-CM | POA: Diagnosis not present

## 2020-08-06 DIAGNOSIS — M9902 Segmental and somatic dysfunction of thoracic region: Secondary | ICD-10-CM | POA: Diagnosis not present

## 2020-08-06 DIAGNOSIS — M9901 Segmental and somatic dysfunction of cervical region: Secondary | ICD-10-CM | POA: Diagnosis not present

## 2020-08-06 DIAGNOSIS — M9903 Segmental and somatic dysfunction of lumbar region: Secondary | ICD-10-CM | POA: Diagnosis not present

## 2020-08-13 DIAGNOSIS — M9905 Segmental and somatic dysfunction of pelvic region: Secondary | ICD-10-CM | POA: Diagnosis not present

## 2020-08-13 DIAGNOSIS — M9903 Segmental and somatic dysfunction of lumbar region: Secondary | ICD-10-CM | POA: Diagnosis not present

## 2020-08-13 DIAGNOSIS — M9902 Segmental and somatic dysfunction of thoracic region: Secondary | ICD-10-CM | POA: Diagnosis not present

## 2020-08-13 DIAGNOSIS — M9901 Segmental and somatic dysfunction of cervical region: Secondary | ICD-10-CM | POA: Diagnosis not present

## 2020-08-15 DIAGNOSIS — F4310 Post-traumatic stress disorder, unspecified: Secondary | ICD-10-CM | POA: Diagnosis not present

## 2020-08-15 DIAGNOSIS — F411 Generalized anxiety disorder: Secondary | ICD-10-CM | POA: Diagnosis not present

## 2020-08-21 DIAGNOSIS — M9905 Segmental and somatic dysfunction of pelvic region: Secondary | ICD-10-CM | POA: Diagnosis not present

## 2020-08-21 DIAGNOSIS — M9902 Segmental and somatic dysfunction of thoracic region: Secondary | ICD-10-CM | POA: Diagnosis not present

## 2020-08-21 DIAGNOSIS — M9901 Segmental and somatic dysfunction of cervical region: Secondary | ICD-10-CM | POA: Diagnosis not present

## 2020-08-21 DIAGNOSIS — M9903 Segmental and somatic dysfunction of lumbar region: Secondary | ICD-10-CM | POA: Diagnosis not present

## 2020-08-29 DIAGNOSIS — F411 Generalized anxiety disorder: Secondary | ICD-10-CM | POA: Diagnosis not present

## 2020-08-29 DIAGNOSIS — F4310 Post-traumatic stress disorder, unspecified: Secondary | ICD-10-CM | POA: Diagnosis not present

## 2020-09-05 DIAGNOSIS — Z03818 Encounter for observation for suspected exposure to other biological agents ruled out: Secondary | ICD-10-CM | POA: Diagnosis not present

## 2020-09-16 DIAGNOSIS — M9902 Segmental and somatic dysfunction of thoracic region: Secondary | ICD-10-CM | POA: Diagnosis not present

## 2020-09-16 DIAGNOSIS — M9901 Segmental and somatic dysfunction of cervical region: Secondary | ICD-10-CM | POA: Diagnosis not present

## 2020-09-16 DIAGNOSIS — M9905 Segmental and somatic dysfunction of pelvic region: Secondary | ICD-10-CM | POA: Diagnosis not present

## 2020-09-16 DIAGNOSIS — M9903 Segmental and somatic dysfunction of lumbar region: Secondary | ICD-10-CM | POA: Diagnosis not present

## 2020-09-19 DIAGNOSIS — F4310 Post-traumatic stress disorder, unspecified: Secondary | ICD-10-CM | POA: Diagnosis not present

## 2020-09-19 DIAGNOSIS — F411 Generalized anxiety disorder: Secondary | ICD-10-CM | POA: Diagnosis not present

## 2020-10-10 DIAGNOSIS — F411 Generalized anxiety disorder: Secondary | ICD-10-CM | POA: Diagnosis not present

## 2020-10-10 DIAGNOSIS — M9903 Segmental and somatic dysfunction of lumbar region: Secondary | ICD-10-CM | POA: Diagnosis not present

## 2020-10-10 DIAGNOSIS — M9905 Segmental and somatic dysfunction of pelvic region: Secondary | ICD-10-CM | POA: Diagnosis not present

## 2020-10-10 DIAGNOSIS — F4310 Post-traumatic stress disorder, unspecified: Secondary | ICD-10-CM | POA: Diagnosis not present

## 2020-10-10 DIAGNOSIS — M9901 Segmental and somatic dysfunction of cervical region: Secondary | ICD-10-CM | POA: Diagnosis not present

## 2020-10-10 DIAGNOSIS — M9902 Segmental and somatic dysfunction of thoracic region: Secondary | ICD-10-CM | POA: Diagnosis not present

## 2020-10-14 DIAGNOSIS — F4310 Post-traumatic stress disorder, unspecified: Secondary | ICD-10-CM | POA: Diagnosis not present

## 2020-10-14 DIAGNOSIS — F411 Generalized anxiety disorder: Secondary | ICD-10-CM | POA: Diagnosis not present

## 2020-10-14 DIAGNOSIS — F902 Attention-deficit hyperactivity disorder, combined type: Secondary | ICD-10-CM | POA: Diagnosis not present

## 2020-10-14 DIAGNOSIS — F329 Major depressive disorder, single episode, unspecified: Secondary | ICD-10-CM | POA: Diagnosis not present

## 2020-10-24 DIAGNOSIS — F4310 Post-traumatic stress disorder, unspecified: Secondary | ICD-10-CM | POA: Diagnosis not present

## 2020-10-24 DIAGNOSIS — F411 Generalized anxiety disorder: Secondary | ICD-10-CM | POA: Diagnosis not present

## 2020-10-29 DIAGNOSIS — M9903 Segmental and somatic dysfunction of lumbar region: Secondary | ICD-10-CM | POA: Diagnosis not present

## 2020-10-29 DIAGNOSIS — M9901 Segmental and somatic dysfunction of cervical region: Secondary | ICD-10-CM | POA: Diagnosis not present

## 2020-10-29 DIAGNOSIS — M9905 Segmental and somatic dysfunction of pelvic region: Secondary | ICD-10-CM | POA: Diagnosis not present

## 2020-10-29 DIAGNOSIS — M9902 Segmental and somatic dysfunction of thoracic region: Secondary | ICD-10-CM | POA: Diagnosis not present

## 2020-11-13 DIAGNOSIS — F4310 Post-traumatic stress disorder, unspecified: Secondary | ICD-10-CM | POA: Diagnosis not present

## 2020-11-13 DIAGNOSIS — F411 Generalized anxiety disorder: Secondary | ICD-10-CM | POA: Diagnosis not present

## 2020-11-21 DIAGNOSIS — F411 Generalized anxiety disorder: Secondary | ICD-10-CM | POA: Diagnosis not present

## 2020-11-21 DIAGNOSIS — F4310 Post-traumatic stress disorder, unspecified: Secondary | ICD-10-CM | POA: Diagnosis not present

## 2020-11-24 DIAGNOSIS — M9903 Segmental and somatic dysfunction of lumbar region: Secondary | ICD-10-CM | POA: Diagnosis not present

## 2020-11-24 DIAGNOSIS — M9901 Segmental and somatic dysfunction of cervical region: Secondary | ICD-10-CM | POA: Diagnosis not present

## 2020-11-24 DIAGNOSIS — M9905 Segmental and somatic dysfunction of pelvic region: Secondary | ICD-10-CM | POA: Diagnosis not present

## 2020-11-24 DIAGNOSIS — M9902 Segmental and somatic dysfunction of thoracic region: Secondary | ICD-10-CM | POA: Diagnosis not present

## 2020-12-17 ENCOUNTER — Ambulatory Visit: Payer: Self-pay | Admitting: Family Medicine

## 2020-12-26 DIAGNOSIS — F411 Generalized anxiety disorder: Secondary | ICD-10-CM | POA: Diagnosis not present

## 2020-12-26 DIAGNOSIS — F4310 Post-traumatic stress disorder, unspecified: Secondary | ICD-10-CM | POA: Diagnosis not present

## 2021-01-06 DIAGNOSIS — F902 Attention-deficit hyperactivity disorder, combined type: Secondary | ICD-10-CM | POA: Diagnosis not present

## 2021-01-06 DIAGNOSIS — F329 Major depressive disorder, single episode, unspecified: Secondary | ICD-10-CM | POA: Diagnosis not present

## 2021-01-06 DIAGNOSIS — F411 Generalized anxiety disorder: Secondary | ICD-10-CM | POA: Diagnosis not present

## 2021-01-06 DIAGNOSIS — F4310 Post-traumatic stress disorder, unspecified: Secondary | ICD-10-CM | POA: Diagnosis not present

## 2021-01-09 DIAGNOSIS — Z113 Encounter for screening for infections with a predominantly sexual mode of transmission: Secondary | ICD-10-CM | POA: Diagnosis not present

## 2021-01-09 DIAGNOSIS — Z01419 Encounter for gynecological examination (general) (routine) without abnormal findings: Secondary | ICD-10-CM | POA: Diagnosis not present

## 2021-01-09 DIAGNOSIS — Z8742 Personal history of other diseases of the female genital tract: Secondary | ICD-10-CM | POA: Diagnosis not present

## 2021-01-12 ENCOUNTER — Other Ambulatory Visit: Payer: Self-pay

## 2021-01-12 ENCOUNTER — Encounter: Payer: Self-pay | Admitting: Family Medicine

## 2021-01-12 ENCOUNTER — Ambulatory Visit (INDEPENDENT_AMBULATORY_CARE_PROVIDER_SITE_OTHER): Payer: BC Managed Care – PPO | Admitting: Family Medicine

## 2021-01-12 VITALS — BP 110/70 | HR 72 | Ht 62.25 in | Wt 123.4 lb

## 2021-01-12 DIAGNOSIS — R5383 Other fatigue: Secondary | ICD-10-CM | POA: Diagnosis not present

## 2021-01-12 DIAGNOSIS — Z1329 Encounter for screening for other suspected endocrine disorder: Secondary | ICD-10-CM

## 2021-01-12 DIAGNOSIS — Z13220 Encounter for screening for lipoid disorders: Secondary | ICD-10-CM | POA: Diagnosis not present

## 2021-01-12 DIAGNOSIS — R202 Paresthesia of skin: Secondary | ICD-10-CM

## 2021-01-12 DIAGNOSIS — Z0000 Encounter for general adult medical examination without abnormal findings: Secondary | ICD-10-CM | POA: Diagnosis not present

## 2021-01-12 DIAGNOSIS — F419 Anxiety disorder, unspecified: Secondary | ICD-10-CM

## 2021-01-12 LAB — HM HIV SCREENING LAB: HM HIV Screening: NEGATIVE

## 2021-01-12 LAB — OB RESULTS CONSOLE GC/CHLAMYDIA: Chlamydia: NEGATIVE

## 2021-01-12 NOTE — Progress Notes (Signed)
Subjective:    Patient ID: Shelby Morris, female    DOB: [birth year], 40 y.o.   MRN: [id number]  HPI Chief Complaint  Patient presents with  . new pt     New pt cpe, some tingling sensation in head and hands and arms. Sees obgyn at [facility]   She is new to the practice and here for a complete physical exam. Previous medical care: [facility] on [address]. Lona Millard, NP   Other providers: [facility]N- [facility]   States she lost 50 lbs in the past year doing Optavia.   She sees a therapist and is a patient at the [facility].   Paresthesias in hands and feet daily for the past 3 months. Tingling in the back of her scalp. Not sure if related to anxiety or something else.   Occasionally she has dizziness when she stands up quickly.    Social history: Lives divorced, 1 child, works in Landscape architect which requires international travel.  Stopped alcohol in 2021. Stopped smoking in 2020. Smoked 1/2 ppd for 15 years.  Diet: healthy diet and on Optavia with maintenance  Excerise: walks   Immunizations: covid vaccines 2 Moderna and 1 Pfizer booster   Health maintenance:  Mammogram: N/A Colonoscopy: 2018 in [place]  Last Gynecological Exam: last week. Hx of abnormal pap smears. Sees OB/GYN  Last Menstrual cycle: none since Nexplanon  Last Dental Exam: upcoming visit in summer  Last Eye Exam: in the past 3 months   Depression screen [facility] 2/9 01/12/2021  Decreased Interest 3  Down, Depressed, Hopeless 0  PHQ - 2 Score 3  Altered sleeping 3  Tired, decreased energy 3  Change in appetite 0  Feeling bad or failure about yourself  0  Trouble concentrating 2  Moving slowly or fidgety/restless 0  Suicidal thoughts 0  PHQ-9 Score 11  Difficult doing work/chores Somewhat difficult     Wears seatbelt always, uses sunscreen, smoke detectors in home and functioning, does not text while driving and feels safe in home environment.    Reviewed allergies, medications, past medical, surgical, family, and social history.   Review of Systems Review of Systems Constitutional: -fever, -chills, -sweats, -unexpected weight change,-fatigue ENT: -runny nose, -ear pain, -sore throat Cardiology:  -chest pain, -palpitations, -edema Respiratory: -cough, -shortness of breath, -wheezing Gastroenterology: -abdominal pain, -nausea, -vomiting, -diarrhea, -constipation  Hematology: -bleeding or bruising problems Musculoskeletal: -arthralgias, -myalgias, -joint swelling, -back pain Ophthalmology: -vision changes Urology: -dysuria, -difficulty urinating, -hematuria, -urinary frequency, -urgency Neurology: -headache, -weakness, +tingling, +numbness       Objective:   Physical Exam BP 110/70   Pulse 72   Ht 5' 2.25" (1.581 m)   Wt 123 lb 6.4 oz (56 kg)   SpO2 98%   BMI 22.39 kg/m   General Appearance:    Alert, cooperative, no distress, appears stated age  Head:    Normocephalic, without obvious abnormality, atraumatic  Eyes:    PERRL, conjunctiva/corneas clear, EOM's intact  Ears:    Normal TM's and external ear canals  Nose:   Mask on   Throat:   Mask on   Neck:   Supple, no lymphadenopathy;  thyroid:  no   enlargement/tenderness/nodules; no JVD  Back:    Spine nontender, no curvature, ROM normal, no CVA     tenderness  Lungs:     Clear to auscultation bilaterally without wheezes, rales or     ronchi; respirations unlabored  Chest Wall:  No tenderness or deformity   Heart:    Regular rate and rhythm, S1 and S2 normal, no murmur, rub   or gallop  Breast Exam:    OB/GYN  Abdomen:     Soft, non-tender, nondistended, normoactive bowel sounds,    no masses, no hepatosplenomegaly  Genitalia:    OB/GYN  Rectal:    Not performed due to age<40 and no related complaints  Extremities:   No clubbing, cyanosis or edema  Pulses:   2+ and symmetric all extremities  Skin:   Skin color, texture, turgor normal, no rashes or lesions   Lymph nodes:   Cervical, supraclavicular, and axillary nodes normal  Neurologic:   CNII-XII intact, normal strength, sensation and gait; reflexes 2+ and symmetric throughout          Psych:   Normal mood, affect, hygiene and grooming.         Assessment & Plan:  Routine general medical examination at a health care facility - Plan: CBC with Differential/Platelet, Comprehensive metabolic panel, TSH, T4, free, Lipid panel -Preventive health care reviewed.  She sees OB/GYN.  Counseled on healthy lifestyle including diet and exercise.  Recommend regular dental and eye exams.  Immunizations reviewed.  Discussed safety and health promotion.  Fatigue, unspecified type - Plan: CBC with Differential/Platelet, Comprehensive metabolic panel, TSH, T4, free, Vitamin B12, VITAMIN D 25 Hydroxy (Vit-D Deficiency, Fractures) -Discussed possible etiologies for fatigue.  I will check labs and follow-up.  Anxiety -Continue seeing mood treatment center and counselor.  Continue medications.  Paresthesias - Plan: CBC with Differential/Platelet, Comprehensive metabolic panel, TSH, T4, free, Vitamin B12 -Check labs and follow-up  Screening for lipid disorders - Plan: Lipid panel  Screening for thyroid disorder - Plan: TSH, T4, free

## 2021-01-12 NOTE — Patient Instructions (Signed)
Preventive Care 40-40 Years Old, Female Preventive care refers to lifestyle choices and visits with your health care provider that can promote health and wellness. This includes:  A yearly physical exam. This is also called an annual wellness visit.  Regular dental and eye exams.  Immunizations.  Screening for certain conditions.  Healthy lifestyle choices, such as: ? Eating a healthy diet. ? Getting regular exercise. ? Not using drugs or products that contain nicotine and tobacco. ? Limiting alcohol use. What can I expect for my preventive care visit? Physical exam Your health care provider may check your:  Height and weight. These may be used to calculate your BMI (body mass index). BMI is a measurement that tells if you are at a healthy weight.  Heart rate and blood pressure.  Body temperature.  Skin for abnormal spots. Counseling Your health care provider may ask you questions about your:  Past medical problems.  Family's medical history.  Alcohol, tobacco, and drug use.  Emotional well-being.  Home life and relationship well-being.  Sexual activity.  Diet, exercise, and sleep habits.  Work and work environment.  Access to firearms.  Method of birth control.  Menstrual cycle.  Pregnancy history. What immunizations do I need? Vaccines are usually given at various ages, according to a schedule. Your health care provider will recommend vaccines for you based on your age, medical history, and lifestyle or other factors, such as travel or where you work.   What tests do I need? Blood tests  Lipid and cholesterol levels. These may be checked every 5 years starting at age 20.  Hepatitis C test.  Hepatitis B test. Screening  Diabetes screening. This is done by checking your blood sugar (glucose) after you have not eaten for a while (fasting).  STD (sexually transmitted disease) testing, if you are at risk.  BRCA-related cancer screening. This may be  done if you have a family history of breast, ovarian, tubal, or peritoneal cancers.  Pelvic exam and Pap test. This may be done every 3 years starting at age 21. Starting at age 30, this may be done every 5 years if you have a Pap test in combination with an HPV test. Talk with your health care provider about your test results, treatment options, and if necessary, the need for more tests.   Follow these instructions at home: Eating and drinking  Eat a healthy diet that includes fresh fruits and vegetables, whole grains, lean protein, and low-fat dairy products.  Take vitamin and mineral supplements as recommended by your health care provider.  Do not drink alcohol if: ? Your health care provider tells you not to drink. ? You are pregnant, may be pregnant, or are planning to become pregnant.  If you drink alcohol: ? Limit how much you have to 0-1 drink a day. ? Be aware of how much alcohol is in your drink. In the [place] one drink equals one 12 oz bottle of beer (355 mL), one 5 oz glass of wine (148 mL), or one 1 oz glass of hard liquor (44 mL).   Lifestyle  Take daily care of your teeth and gums. Brush your teeth every morning and night with fluoride toothpaste. Floss one time each day.  Stay active. Exercise for at least 30 minutes 5 or more days each week.  Do not use any products that contain nicotine or tobacco, such as cigarettes, e-cigarettes, and chewing tobacco. If you need help quitting, ask your health care provider.  Do not   use drugs.  If you are sexually active, practice safe sex. Use a condom or other form of protection to prevent STIs (sexually transmitted infections).  If you do not wish to become pregnant, use a form of birth control. If you plan to become pregnant, see your health care provider for a prepregnancy visit.  Find healthy ways to cope with stress, such as: ? Meditation, yoga, or listening to music. ? Journaling. ? Talking to a trusted  person. ? Spending time with friends and family. Safety  Always wear your seat belt while driving or riding in a vehicle.  Do not drive: ? If you have been drinking alcohol. Do not ride with someone who has been drinking. ? When you are tired or distracted. ? While texting.  Wear a helmet and other protective equipment during sports activities.  If you have firearms in your house, make sure you follow all gun safety procedures.  Seek help if you have been physically or sexually abused. What's next?  Go to your health care provider once a year for an annual wellness visit.  Ask your health care provider how often you should have your eyes and teeth checked.  Stay up to date on all vaccines. This information is not intended to replace advice given to you by your health care provider. Make sure you discuss any questions you have with your health care provider. Document Revised: 04/27/2020 Document Reviewed: 05/11/2018 Elsevier Patient Education  2021 Elsevier Inc.  

## 2021-01-13 LAB — COMPREHENSIVE METABOLIC PANEL
ALT: 27 IU/L (ref 0–32)
AST: 22 IU/L (ref 0–40)
Albumin/Globulin Ratio: 2.3 — ABNORMAL HIGH (ref 1.2–2.2)
Albumin: 4.5 g/dL (ref 3.8–4.8)
Alkaline Phosphatase: 40 IU/L — ABNORMAL LOW (ref 44–121)
BUN/Creatinine Ratio: 13 (ref 9–23)
BUN: 11 mg/dL (ref 6–20)
Bilirubin Total: 0.3 mg/dL (ref 0.0–1.2)
CO2: 24 mmol/L (ref 20–29)
Calcium: 9.1 mg/dL (ref 8.7–10.2)
Chloride: 102 mmol/L (ref 96–106)
Creatinine, Ser: 0.84 mg/dL (ref 0.57–1.00)
Globulin, Total: 2 g/dL (ref 1.5–4.5)
Glucose: 81 mg/dL (ref 65–99)
Potassium: 4.3 mmol/L (ref 3.5–5.2)
Sodium: 138 mmol/L (ref 134–144)
Total Protein: 6.5 g/dL (ref 6.0–8.5)
eGFR: 91 mL/min/{1.73_m2} (ref >59)

## 2021-01-13 LAB — CBC WITH DIFFERENTIAL/PLATELET
Basophils Absolute: 0.1 10*3/uL (ref 0.0–0.2)
Basos: 1 %
EOS (ABSOLUTE): 0.2 10*3/uL (ref 0.0–0.4)
Eos: 3 %
Hematocrit: 39.8 % (ref 34.0–46.6)
Hemoglobin: 13.3 g/dL (ref 11.1–15.9)
Immature Grans (Abs): 0 10*3/uL (ref 0.0–0.1)
Immature Granulocytes: 0 %
Lymphocytes Absolute: 1.9 10*3/uL (ref 0.7–3.1)
Lymphs: 32 %
MCH: 30.3 pg (ref 26.6–33.0)
MCHC: 33.4 g/dL (ref 31.5–35.7)
MCV: 91 fL (ref 79–97)
Monocytes Absolute: 0.4 10*3/uL (ref 0.1–0.9)
Monocytes: 6 %
Neutrophils Absolute: 3.6 10*3/uL (ref 1.4–7.0)
Neutrophils: 58 %
Platelets: 238 10*3/uL (ref 150–450)
RBC: 4.39 x10E6/uL (ref 3.77–5.28)
RDW: 12.4 % (ref 11.7–15.4)
WBC: 6.1 10*3/uL (ref 3.4–10.8)

## 2021-01-13 LAB — LIPID PANEL
Chol/HDL Ratio: 2 ratio (ref 0.0–4.4)
Cholesterol, Total: 132 mg/dL (ref 100–199)
HDL: 66 mg/dL (ref >39)
LDL Chol Calc (NIH): 56 mg/dL (ref 0–99)
Triglycerides: 40 mg/dL (ref 0–149)
VLDL Cholesterol Cal: 10 mg/dL (ref 5–40)

## 2021-01-13 LAB — T4, FREE: Free T4: 1.01 ng/dL (ref 0.82–1.77)

## 2021-01-13 LAB — VITAMIN D 25 HYDROXY (VIT D DEFICIENCY, FRACTURES): Vit D, 25-Hydroxy: 49.3 ng/mL (ref 30.0–100.0)

## 2021-01-13 LAB — VITAMIN B12: Vitamin B-12: 565 pg/mL (ref 232–1245)

## 2021-01-13 LAB — TSH: TSH: 2.83 u[IU]/mL (ref 0.450–4.500)

## 2021-01-13 NOTE — Progress Notes (Signed)
Her labs are fine but she could increase her vitamin B12 intake by 1,000 mg daily if she would like. Nothing to explain her symptoms. Let's see how she does over the next few weeks.

## 2021-01-14 ENCOUNTER — Telehealth: Payer: Self-pay | Admitting: Family Medicine

## 2021-01-14 DIAGNOSIS — F411 Generalized anxiety disorder: Secondary | ICD-10-CM | POA: Diagnosis not present

## 2021-01-14 DIAGNOSIS — F4310 Post-traumatic stress disorder, unspecified: Secondary | ICD-10-CM | POA: Diagnosis not present

## 2021-01-14 NOTE — Telephone Encounter (Signed)
Please let her know that her glucose was normal but we can ask Byrd Hesselbach to add on a Hgb A1c for family history of diabetes.

## 2021-01-14 NOTE — Telephone Encounter (Signed)
I will have Shelby Morris add on test per pt

## 2021-01-14 NOTE — Telephone Encounter (Signed)
Pt called and wanted to see if she can have her A1C checked because Diabetes runs in her family pt was seen Monday for CPE

## 2021-01-15 LAB — SPECIMEN STATUS REPORT

## 2021-01-15 LAB — HGB A1C W/O EAG: Hgb A1c MFr Bld: 5.2 % (ref 4.8–5.6)

## 2021-01-15 NOTE — Progress Notes (Signed)
No sign of diabetes or even prediabetes.

## 2021-01-16 ENCOUNTER — Telehealth: Payer: Self-pay

## 2021-01-16 DIAGNOSIS — M9902 Segmental and somatic dysfunction of thoracic region: Secondary | ICD-10-CM | POA: Diagnosis not present

## 2021-01-16 DIAGNOSIS — M9903 Segmental and somatic dysfunction of lumbar region: Secondary | ICD-10-CM | POA: Diagnosis not present

## 2021-01-16 DIAGNOSIS — M9901 Segmental and somatic dysfunction of cervical region: Secondary | ICD-10-CM | POA: Diagnosis not present

## 2021-01-16 DIAGNOSIS — R202 Paresthesia of skin: Secondary | ICD-10-CM

## 2021-01-16 DIAGNOSIS — M9905 Segmental and somatic dysfunction of pelvic region: Secondary | ICD-10-CM | POA: Diagnosis not present

## 2021-01-16 DIAGNOSIS — R5383 Other fatigue: Secondary | ICD-10-CM

## 2021-01-16 NOTE — Telephone Encounter (Signed)
Pt was notified.  

## 2021-01-16 NOTE — Telephone Encounter (Signed)
Please put in referral to neuro and let her know. Thanks.

## 2021-01-16 NOTE — Telephone Encounter (Signed)
Pt. Called back stating she saw you on 01/12/21 and discussed some symptoms she was having numbness and tingling in her hands, feet, and head and also fatigue. She stats her symptoms have actually gotten worse and she would like a referral to neurology if possible.

## 2021-02-02 ENCOUNTER — Telehealth: Payer: Self-pay | Admitting: Family Medicine

## 2021-02-02 NOTE — Telephone Encounter (Signed)
Received requested records from [facility] [employer].  

## 2021-02-02 NOTE — Telephone Encounter (Signed)
Requested records received from [facility].  

## 2021-02-03 ENCOUNTER — Encounter: Payer: Self-pay | Admitting: Internal Medicine

## 2021-02-04 DIAGNOSIS — F4310 Post-traumatic stress disorder, unspecified: Secondary | ICD-10-CM | POA: Diagnosis not present

## 2021-02-04 DIAGNOSIS — F411 Generalized anxiety disorder: Secondary | ICD-10-CM | POA: Diagnosis not present

## 2021-02-05 ENCOUNTER — Encounter: Payer: Self-pay | Admitting: Family Medicine

## 2021-02-11 ENCOUNTER — Encounter: Payer: Self-pay | Admitting: Family Medicine

## 2021-02-20 DIAGNOSIS — F1011 Alcohol abuse, in remission: Secondary | ICD-10-CM | POA: Diagnosis not present

## 2021-02-20 DIAGNOSIS — F4312 Post-traumatic stress disorder, chronic: Secondary | ICD-10-CM | POA: Diagnosis not present

## 2021-02-20 DIAGNOSIS — F411 Generalized anxiety disorder: Secondary | ICD-10-CM | POA: Diagnosis not present

## 2021-02-26 DIAGNOSIS — F509 Eating disorder, unspecified: Secondary | ICD-10-CM | POA: Diagnosis not present

## 2021-03-06 DIAGNOSIS — M9903 Segmental and somatic dysfunction of lumbar region: Secondary | ICD-10-CM | POA: Diagnosis not present

## 2021-03-06 DIAGNOSIS — M5412 Radiculopathy, cervical region: Secondary | ICD-10-CM | POA: Diagnosis not present

## 2021-03-06 DIAGNOSIS — M9901 Segmental and somatic dysfunction of cervical region: Secondary | ICD-10-CM | POA: Diagnosis not present

## 2021-03-06 DIAGNOSIS — M9902 Segmental and somatic dysfunction of thoracic region: Secondary | ICD-10-CM | POA: Diagnosis not present

## 2021-04-03 DIAGNOSIS — F1011 Alcohol abuse, in remission: Secondary | ICD-10-CM | POA: Diagnosis not present

## 2021-04-03 DIAGNOSIS — F4312 Post-traumatic stress disorder, chronic: Secondary | ICD-10-CM | POA: Diagnosis not present

## 2021-04-03 DIAGNOSIS — F411 Generalized anxiety disorder: Secondary | ICD-10-CM | POA: Diagnosis not present

## 2021-04-09 ENCOUNTER — Ambulatory Visit: Payer: BC Managed Care – PPO | Admitting: Neurology

## 2021-04-09 ENCOUNTER — Telehealth: Payer: Self-pay | Admitting: Neurology

## 2021-04-09 ENCOUNTER — Encounter: Payer: Self-pay | Admitting: Neurology

## 2021-04-09 VITALS — BP 102/73 | HR 73 | Ht 62.0 in | Wt 131.2 lb

## 2021-04-09 DIAGNOSIS — R41840 Attention and concentration deficit: Secondary | ICD-10-CM

## 2021-04-09 DIAGNOSIS — R419 Unspecified symptoms and signs involving cognitive functions and awareness: Secondary | ICD-10-CM

## 2021-04-09 DIAGNOSIS — G43009 Migraine without aura, not intractable, without status migrainosus: Secondary | ICD-10-CM

## 2021-04-09 DIAGNOSIS — R202 Paresthesia of skin: Secondary | ICD-10-CM | POA: Diagnosis not present

## 2021-04-09 MED ORDER — NURTEC 75 MG PO TBDP: 75.0000 mg | ORAL_TABLET | Freq: Once | ORAL | 3 refills | Status: DC | PRN

## 2021-04-09 NOTE — Telephone Encounter (Signed)
MRI brain w/wo contrast BC[employer] auth: NPR  Sent to GI for scheduling JM

## 2021-04-09 NOTE — Patient Instructions (Signed)
It was nice to meet you today.  Your neurological exam is normal thankfully.  She also had a recent updated eye exam.  It is possible that you have intermittent migraine headaches, sometimes migraines are associated with numbness and tingling.  As discussed, we will proceed with a brain MRI with and without contrast to rule out a structural cause of your headaches and tingling.  Please talk to your primary care or behavioral health specialist about evaluation for attention deficit.  Please remember, common headache triggers are: sleep deprivation, dehydration, overheating, stress, hypoglycemia or skipping meals and blood sugar fluctuations, excessive pain medications or excessive alcohol use or caffeine withdrawal. Some people have food triggers such as aged cheese, orange juice or chocolate, especially dark chocolate, or MSG (monosodium glutamate). Try to avoid these headache triggers as much possible. It may be helpful to keep a headache diary to figure out what makes your headaches worse or brings them on and what alleviates them. Some people report headache onset after exercise but studies have shown that regular exercise may actually prevent headaches from coming. If you have exercise-induced headaches, please make sure that you drink plenty of fluid before and after exercising and that you do not over do it and do not overheat.  For migraine acute management, you can continue to try over-the-counter Advil as needed or Excedrin Migraine as needed or Tylenol.  In addition, I will provide a prescription for Nurtec 75 mg strength: Take 1 pill at onset of migraine headache, may repeat in 2 hours, no more than 2 pills in 24 hours. May cause sedation and nausea.   We will call you with your MRI results.  Please follow-up routinely to see one of our nurse practitioners in 3 months, sooner if needed.

## 2021-04-09 NOTE — Progress Notes (Signed)
Subjective:    Patient ID: Shelby Morris is a 40 y.o. female.  HPI    Star Age, MD, PhD [facility] [address], [address] [place], [place] [address]  Dear [name],   I saw your patient, [name], upon your kind request in my neurologic clinic today for initial consultation of her paresthesias.  The patient is unaccompanied today.  As you know, [name] is a 40 year old right-handed woman with an underlying benign medical history with the exception of anxiety and depression, who reports numbness and tingling affecting both upper and lower extremities distally for the past 4 to 5 months.  She reports tingling around the head and face area at times.  She has had intermittent headaches for the past 6 months.  Headaches are often one-sided, sometimes associated with photophobia, no significant nausea or vomiting, no prior history of migraine but has had headaches in the past, especially after excessive alcohol consumption.  She reports that she had a problem with excessive alcohol use in the past.  She stopped drinking alcohol altogether some 18 months ago.  No family history of migraines, 1 grandparent had MS and another had a mini stroke.  She works from home, works on Development worker, community.  She lives with her 85-year-old daughter and 2 cats.  I reviewed your office note from 01/12/2021.  She had blood work at the time which I reviewed: CBC with differential was unremarkable, CMP showed BUN of 11, creatinine 0.84, alk phos 40, AST 22, ALT 27, TSH 2.83, free T4 1.01, vitamin B12 565, vitamin D 49.3, cholesterol panel showed cholesterol of 132, triglycerides 40, HDL 66, LDL 56, A1c 5.2.  She also complained of fatigue at the time.  She denies any sudden onset of one-sided weakness or numbness or tingling or droopy face or slurring of speech, tingling and paresthesias described as pins-and-needles come and go in the hands and feet.  No clear association with  headache at the time.  She has had difficulty with concentration and focus.  She has been seeing a therapist.  Evaluation for ADD has been discussed before.  She has had some medication changes recently.  For about a year she was on Wellbutrin and just about 2 weeks ago has been completely off of it.  Her Celexa was initially reduced but now she is back on a higher dose.  She has been on Celexa for about 5 years altogether.  She quit smoking some 2 years ago.  She drinks caffeine in the form of coffee, about 2 to 3 cups in the mornings.  She sleeps fairly well, denies snoring or apneas.  She is followed by a therapist and behavioral health nurse practitioner.  She has had an updated eye examination within the past few months.  She wears contact lenses and prescription was only slightly changed.  Her Past Medical History Is Significant For: Past Medical History:  Diagnosis Date   Anxiety    History of abnormal Pap smear     Her Past Surgical History Is Significant For: Past Surgical History:  Procedure Laterality Date   CESAREAN SECTION      Her Family History Is Significant For: Family History  Problem Relation Age of Onset   Hypertension Mother    Supraventricular tachycardia Mother    Diabetes Father    Hypertension Father    Obesity Brother    Hypertension Maternal Grandmother    Hypertension Maternal Grandfather     Her Social History  Is Significant For: Social History   Socioeconomic History   Marital status: Divorced    Spouse name: Not on file   Number of children: Not on file   Years of education: Not on file   Highest education level: Not on file  Occupational History   Not on file  Tobacco Use   Smoking status: Former    Packs/day: 0.50    Years: 15.00    Pack years: 7.50    Types: Cigarettes    Quit date: 10/01/2012    Years since quitting: 8.5   Smokeless tobacco: Never  Vaping Use   Vaping Use: Never used  Substance and Sexual Activity   Alcohol use:  No   Drug use: No   Sexual activity: Not on file  Other Topics Concern   Not on file  Social History Narrative   Caffeine: 3 cups coffee, Education : Masters.  Work: office jobEngineering geologist. Children 1 girl, 2 cats.    Social Determinants of Health   Financial Resource Strain: Not on file  Food Insecurity: Not on file  Transportation Needs: Not on file  Physical Activity: Not on file  Stress: Not on file  Social Connections: Not on file    Her Allergies Are:  Allergies  Allergen Reactions   Flagyl [Metronidazole] Rash  :   Her Current Medications Are:  Outpatient Encounter Medications as of 04/09/2021  Medication Sig   Ascorbic Acid (VITAMIN C PO) Take by mouth daily as needed.   citalopram (CELEXA) 20 MG tablet Take 40 mg by mouth daily.   MAGNESIUM CARBONATE PO Take by mouth.   Multiple Vitamin (MULTIVITAMIN) tablet Take 1 tablet by mouth daily.   Probiotic Product (PROBIOTIC DAILY PO) Take by mouth.   Rimegepant Sulfate (NURTEC) 75 MG TBDP Take 75 mg by mouth once as needed for up to 1 dose. May repeat once after 2 hours   [DISCONTINUED] buPROPion (WELLBUTRIN) 75 MG tablet Take 225 mg by mouth daily.   No facility-administered encounter medications on file as of 04/09/2021.  :   Review of Systems:  Out of a complete 14 point review of systems, all are reviewed and negative with the exception of these symptoms as listed below:  Review of Systems  Neurological:        Numbness/ tingling hands, feet, tingling back of head, body fatigue, headache on awakening, disoriented/dizzy.    Objective:  Neurological Exam  Physical Exam Physical Examination:   Vitals:   04/09/21 1428  BP: 102/73  Pulse: 73   General Examination: The patient is a very pleasant 40 y.o. female in no acute distress. She appears well-developed and well-nourished and well groomed.   HEENT: Normocephalic, atraumatic, pupils are equal, round and reactive to light.   Contact lenses in  place.  Funduscopic exam is normal with sharp disc margins noted. Extraocular tracking is good without limitation to gaze excursion or nystagmus noted. Normal smooth pursuit is noted. Hearing is grossly intact. Face is symmetric with normal facial animation and normal facial sensation. Speech is clear with no dysarthria noted. There is no hypophonia. There is no lip, neck/head, jaw or voice tremor. Neck is supple with full range of passive and active motion. There are no carotid bruits on auscultation. Oropharynx exam reveals: mild mouth dryness, good dental hygiene. Tongue protrudes centrally and palate elevates symmetrically.   Chest: Clear to auscultation without wheezing, rhonchi or crackles noted.  Heart: S1+S2+0, regular and normal without murmurs, rubs or gallops noted.  Abdomen: Soft, non-tender and non-distended with normal bowel sounds appreciated on auscultation.  Extremities: There is no pitting edema in the distal lower extremities bilaterally.   Skin: Warm and dry without trophic changes noted.  Musculoskeletal: exam reveals no obvious joint deformities, tenderness or joint swelling or erythema.   Neurologically:  Mental status: The patient is awake, alert and oriented in all 4 spheres. Her immediate and remote memory, attention, language skills and fund of knowledge are appropriate. There is no evidence of aphasia, agnosia, apraxia or anomia. Speech is clear with normal prosody and enunciation. Thought process is linear. Mood is normal and affect is normal.  Cranial nerves II - XII are as described above under HEENT exam. In addition: shoulder shrug is normal with equal shoulder height noted. Motor exam: Normal bulk, strength and tone is noted. There is no drift, tremor or rebound. Romberg is negative. Reflexes are 2+ throughout. Babinski: Toes are flexor bilaterally. Fine motor skills and coordination: intact with normal finger taps, normal hand movements, normal rapid alternating  patting, normal foot taps and normal foot agility.  Cerebellar testing: No dysmetria or intention tremor on finger to nose testing. Heel to shin is unremarkable bilaterally. There is no truncal or gait ataxia.  Sensory exam: intact to light touch, pinprick, vibration, and temperature sense in the upper and lower extremities.  Gait, station and balance: She stands easily. No veering to one side is noted. No leaning to one side is noted. Posture is age-appropriate and stance is narrow based. Gait shows normal stride length and normal pace. No problems turning are noted. Tandem walk is unremarkable.   Assessment and Plan:   In summary, Tylene Quashie is a very pleasant 40 y.o.-year old female with an underlying benign medical history with the exception of anxiety and depression, who presents for evaluation of her paresthesias.  She has had intermittent paresthesias affecting the head and face area, upper and lower extremities intermittently, no sustained numbness, no weakness, has also had recurrent headaches.  While her description of the headache is not very classic and history in the past not telltale for migraines, she may have a migrainous component.  She is reassured that her exam is normal today.  She had an updated eye exam as well.  She had recent blood work through your office which is also benign.  I would like to proceed with a brain MRI with and without contrast to rule out a structural cause of her symptoms.  She is agreeable.  We talked about headache triggers and alleviating factors as well.  She is advised to stay well-hydrated and well rested and continue to use as needed Advil, or Tylenol, or Excedrin Migraine.  She is advised to continue to limit her caffeine intake and avoid drinking alcohol as she has.  She is in follow-up with her therapist on a regular basis and is encouraged to pursue evaluation for her attention and focus issues.  We will call her with her MRI results and plan a  follow-up in this clinic for her to see one of our nurse practitioners in about 3 months.  For as needed use for symptomatic treatment of her migrainous headaches I suggested a trial of Nurtec 75 mg as needed.  She was given written instructions and a new prescription.  I answered all her questions today and she was in agreement with our plan.   Thank you very much for allowing me to participate in the care of this nice patient. If I  can be of any further assistance to you please do not hesitate to call me at [phone].  Sincerely,   Star Age, MD, PhD

## 2021-04-10 DIAGNOSIS — F4310 Post-traumatic stress disorder, unspecified: Secondary | ICD-10-CM | POA: Diagnosis not present

## 2021-04-10 DIAGNOSIS — F411 Generalized anxiety disorder: Secondary | ICD-10-CM | POA: Diagnosis not present

## 2021-04-15 ENCOUNTER — Telehealth: Payer: Self-pay | Admitting: Emergency Medicine

## 2021-04-15 NOTE — Telephone Encounter (Signed)
PA completed on CMM for Nurtec Key # [id number]  Awaiting determination from [facility] [place]

## 2021-04-17 ENCOUNTER — Encounter: Payer: Self-pay | Admitting: Neurology

## 2021-04-20 ENCOUNTER — Ambulatory Visit
Admission: RE | Admit: 2021-04-20 | Discharge: 2021-04-20 | Disposition: A | Discharge status: Home / Self Care | Payer: BC Managed Care – PPO | Source: Ambulatory Visit | Attending: Neurology | Admitting: Neurology

## 2021-04-20 DIAGNOSIS — G43009 Migraine without aura, not intractable, without status migrainosus: Secondary | ICD-10-CM

## 2021-04-20 DIAGNOSIS — R419 Unspecified symptoms and signs involving cognitive functions and awareness: Secondary | ICD-10-CM

## 2021-04-20 DIAGNOSIS — R202 Paresthesia of skin: Secondary | ICD-10-CM

## 2021-04-20 DIAGNOSIS — R41840 Attention and concentration deficit: Secondary | ICD-10-CM

## 2021-04-20 MED ORDER — GADOBENATE DIMEGLUMINE 529 MG/ML IV SOLN
11.0000 mL | Freq: Once | INTRAVENOUS | Status: AC | PRN
  Administered 2021-04-20: 11 mL via INTRAVENOUS

## 2021-04-22 ENCOUNTER — Encounter: Payer: Self-pay | Admitting: Neurology

## 2021-04-22 NOTE — Telephone Encounter (Signed)
Shelby Foley, MD  04/21/2021  7:36 AM EDT      Please call patient and advise her that her recent brain MRI with and without contrast showed no acute findings.  She had 1 small white spot in the front of the brain, this is often seen in patients who have recurrent headaches of migraines.  There was no mass lesion or stroke or hardening of the artery-type changes otherwise.  Overall a fairly benign test with normal contrast uptake aswell.  She can follow-up as scheduled.

## 2021-04-23 NOTE — Telephone Encounter (Signed)
Called [facility] [place].  Was informed they have received the PA and they have up to 15 days to decide.  They will inform through [facility] once decided.    Patient updated to status through MyChart.

## 2021-04-27 NOTE — Telephone Encounter (Signed)
Denied today PA Case: [id number].  Patient received notice from [place] [place].  Asked to try Imitrex.  Sent patient message.

## 2021-04-29 ENCOUNTER — Telehealth: Payer: Self-pay | Admitting: Neurology

## 2021-04-29 ENCOUNTER — Encounter: Payer: Self-pay | Admitting: *Deleted

## 2021-04-29 NOTE — Telephone Encounter (Signed)
Per Dr. Frances Furbish:  Shelby Foley, MD     12:05 PM Note Please see if they can make an exception to the Nurtec denial as patient is on an SSRI, Celexa, and there could be an interaction between an SSRI and a triptan such as serotonin syndrome risk.  I would like to avoid a triptan in her case

## 2021-04-29 NOTE — Telephone Encounter (Signed)
-----   Message from Lenise Arena, RN sent at 04/29/2021 10:54 AM EDT ----- Good morning Dr. Frances Furbish.   The insurance denied the prescription for Nurtec.  She has to try 2 generic triptans., ie., sumatriptan, rizatriptan, naratriptan or zolmitrptan first.    Thank you Cicero Duck

## 2021-04-29 NOTE — Telephone Encounter (Signed)
Appeal for Nurtec completed in [facility] [id number]  Awaiting determination from [facility] [place].

## 2021-04-29 NOTE — Telephone Encounter (Signed)
Appeal pending. See other phone note for PA.

## 2021-04-29 NOTE — Telephone Encounter (Signed)
Received fax denial from [place] [place]. Patient must try 2 separate generic triptans prior to approval of Nurtec.  Sumatriptan Rizatriptan Naratriptan Zolmitriptan  Dr. Frances Furbish sent message with same.

## 2021-04-29 NOTE — Telephone Encounter (Signed)
Please see if they can make an exception to the Nurtec denial as patient is on an SSRI, Celexa, and there could be an interaction between an SSRI and a triptan such as serotonin syndrome risk.  I would like to avoid a triptan in her case.

## 2021-04-29 NOTE — Progress Notes (Signed)
error 

## 2021-05-01 DIAGNOSIS — F1011 Alcohol abuse, in remission: Secondary | ICD-10-CM | POA: Diagnosis not present

## 2021-05-01 DIAGNOSIS — F411 Generalized anxiety disorder: Secondary | ICD-10-CM | POA: Diagnosis not present

## 2021-05-01 DIAGNOSIS — F4312 Post-traumatic stress disorder, chronic: Secondary | ICD-10-CM | POA: Diagnosis not present

## 2021-05-20 ENCOUNTER — Other Ambulatory Visit: Payer: Self-pay | Admitting: Nurse Practitioner

## 2021-05-20 DIAGNOSIS — Z1231 Encounter for screening mammogram for malignant neoplasm of breast: Secondary | ICD-10-CM

## 2021-06-01 ENCOUNTER — Other Ambulatory Visit: Payer: Self-pay

## 2021-06-01 ENCOUNTER — Ambulatory Visit
Admission: RE | Admit: 2021-06-01 | Discharge: 2021-06-01 | Disposition: A | Discharge status: Home / Self Care | Payer: BC Managed Care – PPO | Source: Ambulatory Visit | Attending: Nurse Practitioner | Admitting: Nurse Practitioner

## 2021-06-01 DIAGNOSIS — Z1231 Encounter for screening mammogram for malignant neoplasm of breast: Secondary | ICD-10-CM | POA: Diagnosis not present

## 2021-06-03 DIAGNOSIS — F4310 Post-traumatic stress disorder, unspecified: Secondary | ICD-10-CM | POA: Diagnosis not present

## 2021-06-03 DIAGNOSIS — F411 Generalized anxiety disorder: Secondary | ICD-10-CM | POA: Diagnosis not present

## 2021-06-05 ENCOUNTER — Other Ambulatory Visit: Payer: Self-pay | Admitting: Nurse Practitioner

## 2021-06-05 DIAGNOSIS — R928 Other abnormal and inconclusive findings on diagnostic imaging of breast: Secondary | ICD-10-CM

## 2021-06-17 DIAGNOSIS — M9903 Segmental and somatic dysfunction of lumbar region: Secondary | ICD-10-CM | POA: Diagnosis not present

## 2021-06-17 DIAGNOSIS — M9902 Segmental and somatic dysfunction of thoracic region: Secondary | ICD-10-CM | POA: Diagnosis not present

## 2021-06-17 DIAGNOSIS — M5412 Radiculopathy, cervical region: Secondary | ICD-10-CM | POA: Diagnosis not present

## 2021-06-17 DIAGNOSIS — M9901 Segmental and somatic dysfunction of cervical region: Secondary | ICD-10-CM | POA: Diagnosis not present

## 2021-06-18 DIAGNOSIS — F1011 Alcohol abuse, in remission: Secondary | ICD-10-CM | POA: Diagnosis not present

## 2021-06-18 DIAGNOSIS — F411 Generalized anxiety disorder: Secondary | ICD-10-CM | POA: Diagnosis not present

## 2021-06-18 DIAGNOSIS — F4312 Post-traumatic stress disorder, chronic: Secondary | ICD-10-CM | POA: Diagnosis not present

## 2021-06-19 ENCOUNTER — Other Ambulatory Visit: Payer: Self-pay

## 2021-06-19 ENCOUNTER — Ambulatory Visit
Admission: RE | Admit: 2021-06-19 | Discharge: 2021-06-19 | Disposition: A | Discharge status: Home / Self Care | Payer: BC Managed Care – PPO | Source: Ambulatory Visit | Attending: Nurse Practitioner | Admitting: Nurse Practitioner

## 2021-06-19 ENCOUNTER — Ambulatory Visit: Payer: BC Managed Care – PPO

## 2021-06-19 DIAGNOSIS — R922 Inconclusive mammogram: Secondary | ICD-10-CM | POA: Diagnosis not present

## 2021-06-19 DIAGNOSIS — R928 Other abnormal and inconclusive findings on diagnostic imaging of breast: Secondary | ICD-10-CM

## 2021-06-22 DIAGNOSIS — M5412 Radiculopathy, cervical region: Secondary | ICD-10-CM | POA: Diagnosis not present

## 2021-06-22 DIAGNOSIS — M9903 Segmental and somatic dysfunction of lumbar region: Secondary | ICD-10-CM | POA: Diagnosis not present

## 2021-06-22 DIAGNOSIS — M9901 Segmental and somatic dysfunction of cervical region: Secondary | ICD-10-CM | POA: Diagnosis not present

## 2021-06-22 DIAGNOSIS — M9902 Segmental and somatic dysfunction of thoracic region: Secondary | ICD-10-CM | POA: Diagnosis not present

## 2021-06-23 ENCOUNTER — Ambulatory Visit: Payer: Self-pay | Admitting: Family Medicine

## 2021-06-26 DIAGNOSIS — M9901 Segmental and somatic dysfunction of cervical region: Secondary | ICD-10-CM | POA: Diagnosis not present

## 2021-06-26 DIAGNOSIS — M9902 Segmental and somatic dysfunction of thoracic region: Secondary | ICD-10-CM | POA: Diagnosis not present

## 2021-06-26 DIAGNOSIS — M5412 Radiculopathy, cervical region: Secondary | ICD-10-CM | POA: Diagnosis not present

## 2021-06-26 DIAGNOSIS — M9903 Segmental and somatic dysfunction of lumbar region: Secondary | ICD-10-CM | POA: Diagnosis not present

## 2021-06-30 DIAGNOSIS — F4312 Post-traumatic stress disorder, chronic: Secondary | ICD-10-CM | POA: Diagnosis not present

## 2021-07-08 DIAGNOSIS — F329 Major depressive disorder, single episode, unspecified: Secondary | ICD-10-CM | POA: Diagnosis not present

## 2021-07-08 DIAGNOSIS — F4310 Post-traumatic stress disorder, unspecified: Secondary | ICD-10-CM | POA: Diagnosis not present

## 2021-07-08 DIAGNOSIS — F1010 Alcohol abuse, uncomplicated: Secondary | ICD-10-CM | POA: Diagnosis not present

## 2021-07-08 DIAGNOSIS — F902 Attention-deficit hyperactivity disorder, combined type: Secondary | ICD-10-CM | POA: Diagnosis not present

## 2021-07-09 DIAGNOSIS — M5412 Radiculopathy, cervical region: Secondary | ICD-10-CM | POA: Diagnosis not present

## 2021-07-09 DIAGNOSIS — M9903 Segmental and somatic dysfunction of lumbar region: Secondary | ICD-10-CM | POA: Diagnosis not present

## 2021-07-09 DIAGNOSIS — M9902 Segmental and somatic dysfunction of thoracic region: Secondary | ICD-10-CM | POA: Diagnosis not present

## 2021-07-09 DIAGNOSIS — M9901 Segmental and somatic dysfunction of cervical region: Secondary | ICD-10-CM | POA: Diagnosis not present

## 2021-07-17 DIAGNOSIS — M5412 Radiculopathy, cervical region: Secondary | ICD-10-CM | POA: Diagnosis not present

## 2021-07-17 DIAGNOSIS — M9903 Segmental and somatic dysfunction of lumbar region: Secondary | ICD-10-CM | POA: Diagnosis not present

## 2021-07-17 DIAGNOSIS — M9901 Segmental and somatic dysfunction of cervical region: Secondary | ICD-10-CM | POA: Diagnosis not present

## 2021-07-17 DIAGNOSIS — M9902 Segmental and somatic dysfunction of thoracic region: Secondary | ICD-10-CM | POA: Diagnosis not present

## 2021-07-22 ENCOUNTER — Encounter: Payer: Self-pay | Admitting: Neurology

## 2021-07-22 ENCOUNTER — Ambulatory Visit: Payer: BC Managed Care – PPO | Admitting: Neurology

## 2021-07-22 VITALS — BP 99/66 | HR 67 | Ht 62.0 in | Wt 126.0 lb

## 2021-07-22 DIAGNOSIS — G43009 Migraine without aura, not intractable, without status migrainosus: Secondary | ICD-10-CM

## 2021-07-22 DIAGNOSIS — R202 Paresthesia of skin: Secondary | ICD-10-CM | POA: Diagnosis not present

## 2021-07-22 DIAGNOSIS — F4312 Post-traumatic stress disorder, chronic: Secondary | ICD-10-CM | POA: Diagnosis not present

## 2021-07-22 NOTE — Progress Notes (Signed)
Subjective:    Patient ID: Carrye Goller is a 40 y.o. female.  HPI    Interim history:   Ms. Dolce is a 40 year old right-handed woman with an underlying benign medical history with the exception of anxiety and depression, who presents for follow-up consultation of her paresthesias and intermittent headaches.  The patient is unaccompanied today.  I first met her at the request of her primary care PA, Ms. Raenette Rover on 04/09/2021, at which time the patient reported paresthesias affecting all 4 extremities for the past several months.  Her neurological exam is nonfocal.  She was advised to proceed with a brain MRI.  She had a brain MRI with and without contrast on 04/20/2021 and I reviewed the results:   IMPRESSION: This MRI of the brain with and without contrast shows the following: 1.  Single punctate T2/FLAIR hyperintense focus in the deep white matter of the left frontal lobe.  This most likely represents either the sequela of migraine headaches or a single focus of chronic microvascular ischemic change. 2.  No acute findings.  Normal enhancement pattern.   She was notified of the test results.  I suggested she start Nurtec as needed for migrainous headaches.  Today, 07/22/2021: She reports that her insurance did not cover the Nurtec.  She did not use the co-pay card.  She ended up starting Topamax per psychiatry and is currently taking 50 mg twice daily.  Her headaches are better.  She feels that Topamax has helped.  She has noticed additional tingling after starting the Topamax.  She has intermittent paresthesias around the head, nothing new, nothing sustained.  She denies any sudden onset of weakness or droopy face or slurring of speech.  She is typically hydrating well with water, she is working on reducing her caffeine.  She has noticed that her sparkling water does not taste as good after she started the Topamax.  She has intermittent lightheadedness especially when she first stands up, feeling  slightly dizzy but has not had any sustained dizziness.  Overall, she feels well.  She has had an up-to-date eye exam.  She had a wellness check with her primary care.  She does have some fatigue.  She had extensive blood work in May 2022.  She does endorse that some of her symptoms seem to be tied in with anxiety.  She feels that her anxiety has improved.  The patient's allergies, current medications, family history, past medical history, past social history, past surgical history and problem list were reviewed and updated as appropriate.    Previously:   04/09/21: (She) reports numbness and tingling affecting both upper and lower extremities distally for the past 4 to 5 months.  She reports tingling around the head and face area at times.  She has had intermittent headaches for the past 6 months.  Headaches are often one-sided, sometimes associated with photophobia, no significant nausea or vomiting, no prior history of migraine but has had headaches in the past, especially after excessive alcohol consumption.  She reports that she had a problem with excessive alcohol use in the past.  She stopped drinking alcohol altogether some 18 months ago.  No family history of migraines, 1 grandparent had MS and another had a mini stroke.  She works from home, works on Development worker, community.  She lives with her 86-year-old daughter and 2 cats.  I reviewed your office note from 01/12/2021.  She had blood work at the time which I reviewed: CBC with differential was unremarkable, CMP  showed BUN of 11, creatinine 0.84, alk phos 40, AST 22, ALT 27, TSH 2.83, free T4 1.01, vitamin B12 565, vitamin D 49.3, cholesterol panel showed cholesterol of 132, triglycerides 40, HDL 66, LDL 56, A1c 5.2.  She also complained of fatigue at the time.  She denies any sudden onset of one-sided weakness or numbness or tingling or droopy face or slurring of speech, tingling and paresthesias described as pins-and-needles come and go in the hands  and feet.  No clear association with headache at the time.  She has had difficulty with concentration and focus.  She has been seeing a therapist.  Evaluation for ADD has been discussed before.  She has had some medication changes recently.  For about a year she was on Wellbutrin and just about 2 weeks ago has been completely off of it.  Her Celexa was initially reduced but now she is back on a higher dose.  She has been on Celexa for about 5 years altogether.  She quit smoking some 2 years ago.  She drinks caffeine in the form of coffee, about 2 to 3 cups in the mornings.  She sleeps fairly well, denies snoring or apneas.   She is followed by a therapist and behavioral health nurse practitioner.   She has had an updated eye examination within the past few months.  She wears contact lenses and prescription was only slightly changed.   Her Past Medical History Is Significant For: Past Medical History:  Diagnosis Date   Anxiety    History of abnormal Pap smear     Her Past Surgical History Is Significant For: Past Surgical History:  Procedure Laterality Date   CESAREAN SECTION      Her Family History Is Significant For: Family History  Problem Relation Age of Onset   Hypertension Mother    Supraventricular tachycardia Mother    Diabetes Father    Hypertension Father    Obesity Brother    Hypertension Maternal Grandmother    Hypertension Maternal Grandfather     Her Social History Is Significant For: Social History   Socioeconomic History   Marital status: Divorced    Spouse name: Not on file   Number of children: Not on file   Years of education: Not on file   Highest education level: Not on file  Occupational History   Not on file  Tobacco Use   Smoking status: Former    Packs/day: 0.50    Years: 15.00    Pack years: 7.50    Types: Cigarettes    Quit date: 10/01/2012    Years since quitting: 8.8   Smokeless tobacco: Never  Vaping Use   Vaping Use: Former  Substance  and Sexual Activity   Alcohol use: No   Drug use: No   Sexual activity: Not on file  Other Topics Concern   Not on file  Social History Narrative   Caffeine: 3 cups coffee/day , Education : Masters.  Work: office jobEngineering geologist. Children 1 girl, 2 cats.    Social Determinants of Health   Financial Resource Strain: Not on file  Food Insecurity: Not on file  Transportation Needs: Not on file  Physical Activity: Not on file  Stress: Not on file  Social Connections: Not on file    Her Allergies Are:  Allergies  Allergen Reactions   Flagyl [Metronidazole] Rash  :   Her Current Medications Are:  Outpatient Encounter Medications as of 07/22/2021  Medication Sig   Ascorbic  Acid (VITAMIN C PO) Take by mouth daily as needed.   citalopram (CELEXA) 20 MG tablet Take 40 mg by mouth daily.   MAGNESIUM CARBONATE PO Take by mouth.   Multiple Vitamin (MULTIVITAMIN) tablet Take 1 tablet by mouth daily.   Probiotic Product (PROBIOTIC DAILY PO) Take by mouth.   topiramate (TOPAMAX) 50 MG tablet Take 50 mg by mouth 2 (two) times daily.   Rimegepant Sulfate (NURTEC) 75 MG TBDP Take 75 mg by mouth once as needed for up to 1 dose. May repeat once after 2 hours (Patient not taking: Reported on 07/22/2021)   No facility-administered encounter medications on file as of 07/22/2021.  :  Review of Systems:  Out of a complete 14 point review of systems, all are reviewed and negative with the exception of these symptoms as listed below:  Review of Systems  Neurological:        Here alone for migraine follow-up. She has tingling with the migraines. Her insurance did not approve the Nurtec. She also spoke with her psychiatrist who adjusted some of her medications. She tried Topamax for both depression and migraines and it has been helpful with the headaches but she still has tingling in her head and hands. She still has lightheadedness and fatigue on occasion.   Objective:  Neurological  Exam  Physical Exam Physical Examination:   Vitals:   07/22/21 1227  BP: 99/66  Pulse: 67    General Examination: The patient is a very pleasant 40 y.o. female in no acute distress. She appears well-developed and well-nourished and well groomed.   HEENT: Normocephalic, atraumatic, pupils are equal, round and reactive to light, extraocular tracking is well-preserved, face is symmetric and normal facial animation.  Speech is clear without dysarthria, hypophonia or voice tremor.  Neck is supple, no carotid bruits.  Airway examination reveals no significant mouth dryness.  Tongue protrudes centrally and palate elevates symmetrically.   Chest: Clear to auscultation without wheezing, rhonchi or crackles noted.   Heart: S1+S2+0, regular and normal without murmurs, rubs or gallops noted.    Abdomen: Soft, non-tender and non-distended.   Extremities: There is no obvious edema.    Skin: Warm and dry without trophic changes noted.   Musculoskeletal: exam reveals no obvious joint deformities.    Neurologically:  Mental status: The patient is awake, alert and oriented in all 4 spheres. Her immediate and remote memory, attention, language skills and fund of knowledge are appropriate. There is no evidence of aphasia, agnosia, apraxia or anomia. Speech is clear with normal prosody and enunciation. Thought process is linear. Mood is normal and affect is normal.  Cranial nerves II - XII are as described above under HEENT exam.  Motor exam: Normal bulk, strength and tone is noted. There is no drift, tremor or rebound. Romberg is negative. Reflexes are 2+ throughout. Fine motor skills and coordination: intact with normal finger taps, normal hand movements, normal rapid alternating patting, normal foot taps.  Cerebellar testing: No dysmetria or intention tremor on finger to nose testing. Heel to shin is unremarkable bilaterally. There is no truncal or gait ataxia.  Sensory exam: intact to light touch in  the upper and lower extremities.  Gait, station and balance: She stands easily. No veering to one side is noted. No leaning to one side is noted. Posture is age-appropriate and stance is narrow based. Gait shows normal stride length and normal pace. No problems turning are noted. Tandem walk is unremarkable.    Assessment and Plan:  In summary, Kenyia Wambolt is a very pleasant 40 year old female with an underlying benign medical history with the exception of anxiety and depression, who presents for follow-up consultation of her intermittent paresthesias.  She had a brain MRI with and without contrast in August 2022 which showed benign findings.  She was recently started on Topamax by her psychiatrist provider and feels that she has overall improved with respect to her headaches.  She does have a history of migraine-like headaches.  She was not able to fill the prescription for Nurtec but was advised today that she could potentially use a co-pay card nonetheless.  However, since she is feeling better, we mutually agreed not to add any new medications at this time.  She has a stable and normal neurological exam, she is up-to-date with her eye exam.  We talked about headache triggers, we talked about stress at home with her eyes and with some of her symptoms.  She has had some fleeting dizziness but no sustained symptoms, nothing to suggest vertigo.  She is at this juncture advised to continue to work on healthy lifestyle, stay well-hydrated, and well rested.  She is advised to follow-up in this clinic on an as-needed basis.  I answered all her questions today and she was in agreement.I spent 30 minutes in total face-to-face time and in reviewing records during pre-charting, more than 50% of which was spent in counseling and coordination of care, reviewing test results, reviewing medications and treatment regimen and/or in discussing or reviewing the diagnosis of HAs, paresthesias, the prognosis and treatment  options. Pertinent laboratory and imaging test results that were available during this visit with the patient were reviewed by me and considered in my medical decision making (see chart for details).

## 2021-07-22 NOTE — Patient Instructions (Signed)
It was nice to see you again today.  I am glad to hear that your headaches are under better control with the addition of Topamax 50 mg twice daily through your psychiatrist provider.  Your brain MRI looks fine, exam looks good.  Please continue to maintain a healthy lifestyle, hydrate well with water, stay well rested, limit your caffeine to 1 or 2 servings per day.  At this juncture, I can see you on an as-needed basis.

## 2021-07-29 DIAGNOSIS — D1801 Hemangioma of skin and subcutaneous tissue: Secondary | ICD-10-CM | POA: Diagnosis not present

## 2021-07-29 DIAGNOSIS — L821 Other seborrheic keratosis: Secondary | ICD-10-CM | POA: Diagnosis not present

## 2021-07-29 DIAGNOSIS — D229 Melanocytic nevi, unspecified: Secondary | ICD-10-CM | POA: Diagnosis not present

## 2021-07-30 ENCOUNTER — Ambulatory Visit: Payer: BC Managed Care – PPO | Admitting: Family Medicine

## 2021-08-04 DIAGNOSIS — F329 Major depressive disorder, single episode, unspecified: Secondary | ICD-10-CM | POA: Diagnosis not present

## 2021-08-04 DIAGNOSIS — F4310 Post-traumatic stress disorder, unspecified: Secondary | ICD-10-CM | POA: Diagnosis not present

## 2021-08-04 DIAGNOSIS — F1010 Alcohol abuse, uncomplicated: Secondary | ICD-10-CM | POA: Diagnosis not present

## 2021-08-04 DIAGNOSIS — F902 Attention-deficit hyperactivity disorder, combined type: Secondary | ICD-10-CM | POA: Diagnosis not present

## 2021-08-27 DIAGNOSIS — F4312 Post-traumatic stress disorder, chronic: Secondary | ICD-10-CM | POA: Diagnosis not present

## 2021-09-01 DIAGNOSIS — F902 Attention-deficit hyperactivity disorder, combined type: Secondary | ICD-10-CM | POA: Diagnosis not present

## 2021-09-01 DIAGNOSIS — F329 Major depressive disorder, single episode, unspecified: Secondary | ICD-10-CM | POA: Diagnosis not present

## 2021-09-01 DIAGNOSIS — F1010 Alcohol abuse, uncomplicated: Secondary | ICD-10-CM | POA: Diagnosis not present

## 2021-09-01 DIAGNOSIS — F4310 Post-traumatic stress disorder, unspecified: Secondary | ICD-10-CM | POA: Diagnosis not present

## 2021-09-10 DIAGNOSIS — F4312 Post-traumatic stress disorder, chronic: Secondary | ICD-10-CM | POA: Diagnosis not present

## 2021-09-24 DIAGNOSIS — F4312 Post-traumatic stress disorder, chronic: Secondary | ICD-10-CM | POA: Diagnosis not present

## 2021-10-01 DIAGNOSIS — F329 Major depressive disorder, single episode, unspecified: Secondary | ICD-10-CM | POA: Diagnosis not present

## 2021-10-01 DIAGNOSIS — F902 Attention-deficit hyperactivity disorder, combined type: Secondary | ICD-10-CM | POA: Diagnosis not present

## 2021-10-01 DIAGNOSIS — F4310 Post-traumatic stress disorder, unspecified: Secondary | ICD-10-CM | POA: Diagnosis not present

## 2021-10-01 DIAGNOSIS — F1010 Alcohol abuse, uncomplicated: Secondary | ICD-10-CM | POA: Diagnosis not present

## 2021-10-08 DIAGNOSIS — F4312 Post-traumatic stress disorder, chronic: Secondary | ICD-10-CM | POA: Diagnosis not present

## 2021-10-28 DIAGNOSIS — F4312 Post-traumatic stress disorder, chronic: Secondary | ICD-10-CM | POA: Diagnosis not present

## 2021-10-29 DIAGNOSIS — F1010 Alcohol abuse, uncomplicated: Secondary | ICD-10-CM | POA: Diagnosis not present

## 2021-10-29 DIAGNOSIS — F902 Attention-deficit hyperactivity disorder, combined type: Secondary | ICD-10-CM | POA: Diagnosis not present

## 2021-10-29 DIAGNOSIS — F4310 Post-traumatic stress disorder, unspecified: Secondary | ICD-10-CM | POA: Diagnosis not present

## 2021-10-29 DIAGNOSIS — F329 Major depressive disorder, single episode, unspecified: Secondary | ICD-10-CM | POA: Diagnosis not present

## 2021-12-28 DIAGNOSIS — F1010 Alcohol abuse, uncomplicated: Secondary | ICD-10-CM | POA: Diagnosis not present

## 2021-12-28 DIAGNOSIS — F329 Major depressive disorder, single episode, unspecified: Secondary | ICD-10-CM | POA: Diagnosis not present

## 2021-12-28 DIAGNOSIS — F4310 Post-traumatic stress disorder, unspecified: Secondary | ICD-10-CM | POA: Diagnosis not present

## 2021-12-28 DIAGNOSIS — F411 Generalized anxiety disorder: Secondary | ICD-10-CM | POA: Diagnosis not present

## 2022-01-15 DIAGNOSIS — F411 Generalized anxiety disorder: Secondary | ICD-10-CM | POA: Diagnosis not present

## 2022-01-18 DIAGNOSIS — Z130 Encounter for screening for diseases of the blood and blood-forming organs and certain disorders involving the immune mechanism: Secondary | ICD-10-CM | POA: Diagnosis not present

## 2022-01-18 DIAGNOSIS — E559 Vitamin D deficiency, unspecified: Secondary | ICD-10-CM | POA: Diagnosis not present

## 2022-01-18 DIAGNOSIS — Z13220 Encounter for screening for lipoid disorders: Secondary | ICD-10-CM | POA: Diagnosis not present

## 2022-01-18 DIAGNOSIS — Z0000 Encounter for general adult medical examination without abnormal findings: Secondary | ICD-10-CM | POA: Diagnosis not present

## 2022-01-22 DIAGNOSIS — F411 Generalized anxiety disorder: Secondary | ICD-10-CM | POA: Diagnosis not present

## 2022-02-05 DIAGNOSIS — F411 Generalized anxiety disorder: Secondary | ICD-10-CM | POA: Diagnosis not present

## 2022-02-07 IMAGING — MG MM DIGITAL DIAGNOSTIC UNILAT*R* W/ TOMO W/ CAD
4 series · 4 of 12 positions shown · non-contrast
Comparison: Screening exam, 06/01/2021.

CLINICAL DATA: Screening recall for possible architectural
distortion in the right breast. The screening study was the baseline
exam.

EXAM:
DIGITAL DIAGNOSTIC UNILATERAL RIGHT MAMMOGRAM WITH TOMOSYNTHESIS AND
CAD
TECHNIQUE: Right digital diagnostic mammography and breast tomosynthesis was
performed. The images were evaluated with computer-aided detection.

[R CC synth-2D]
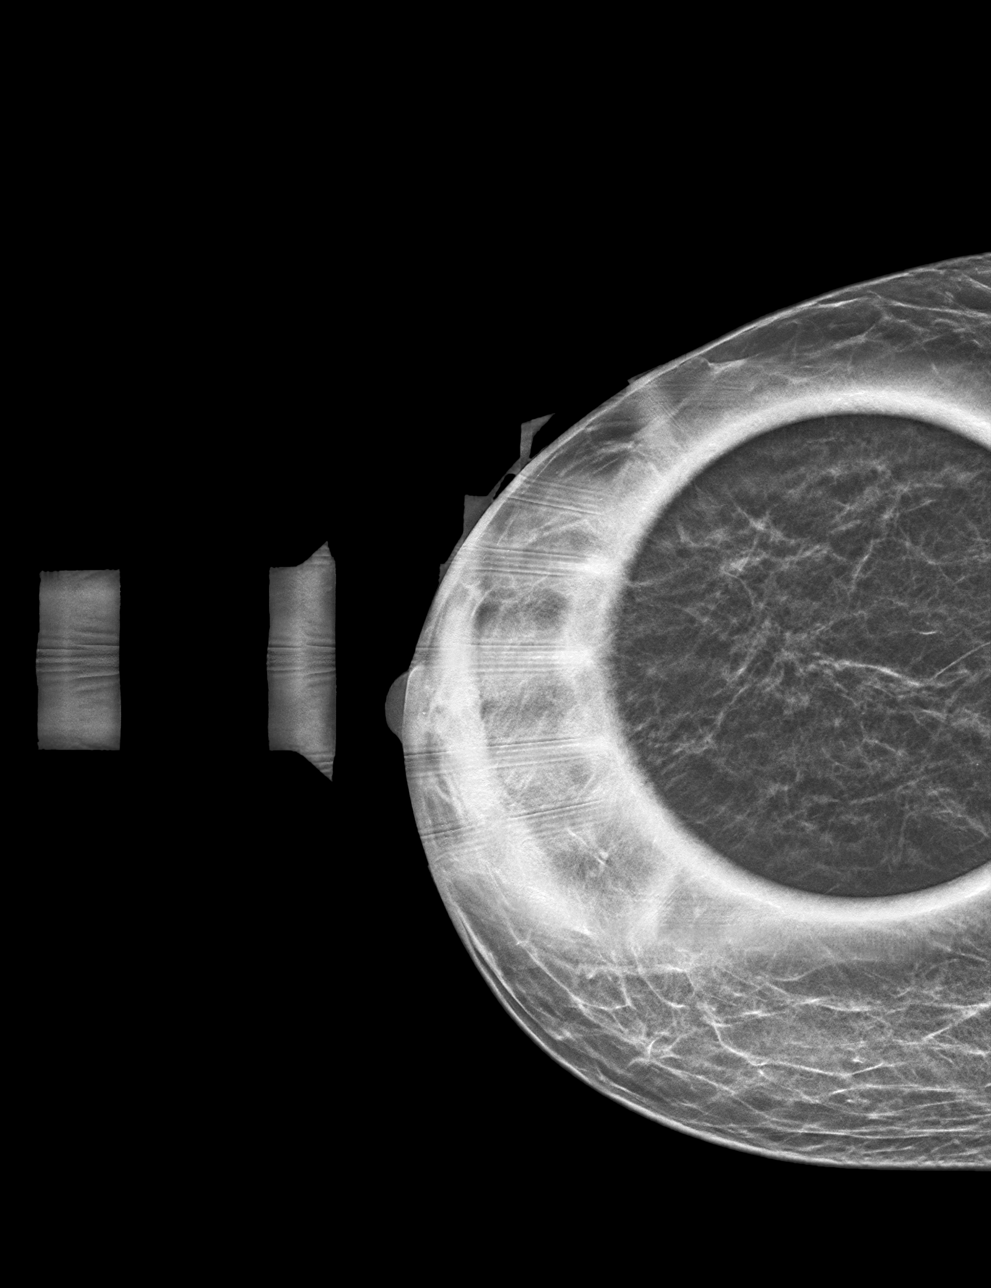

[R ML synth-2D]
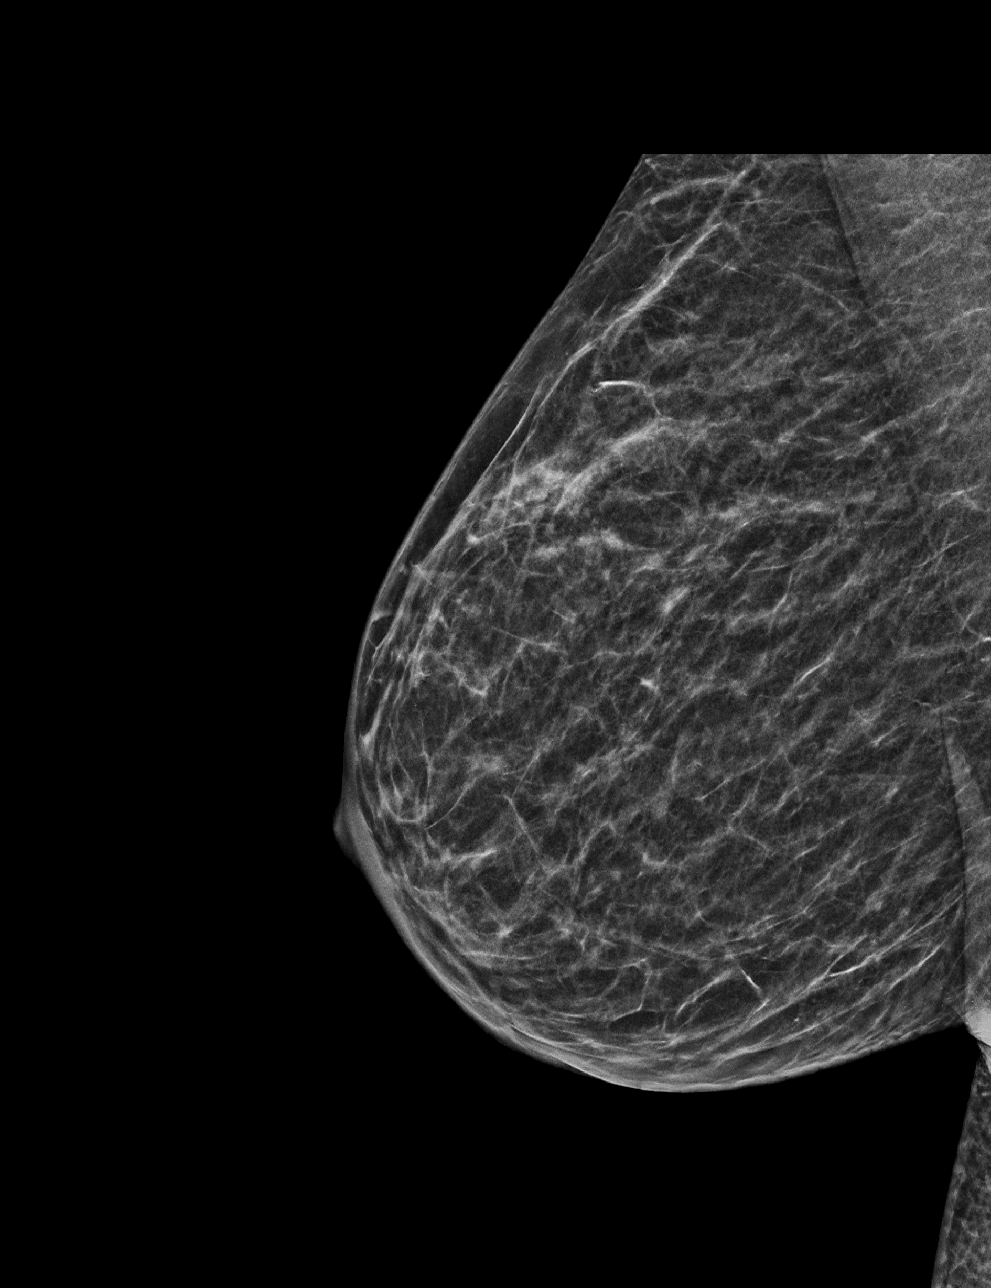

[R CC tomo · tomo slice 17/32.0]
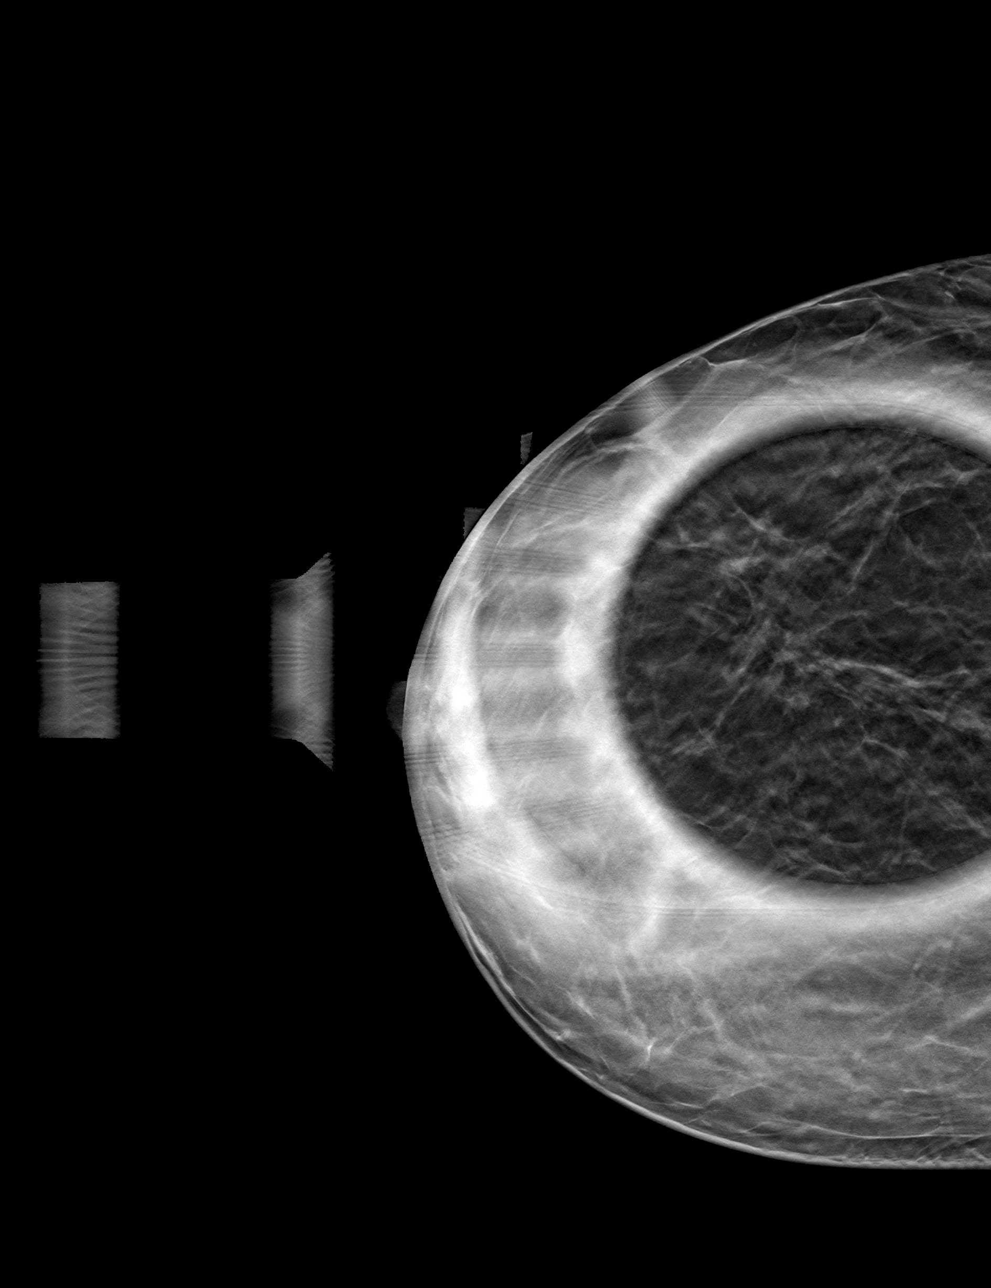

[R ML tomo · tomo slice 20/39.0]
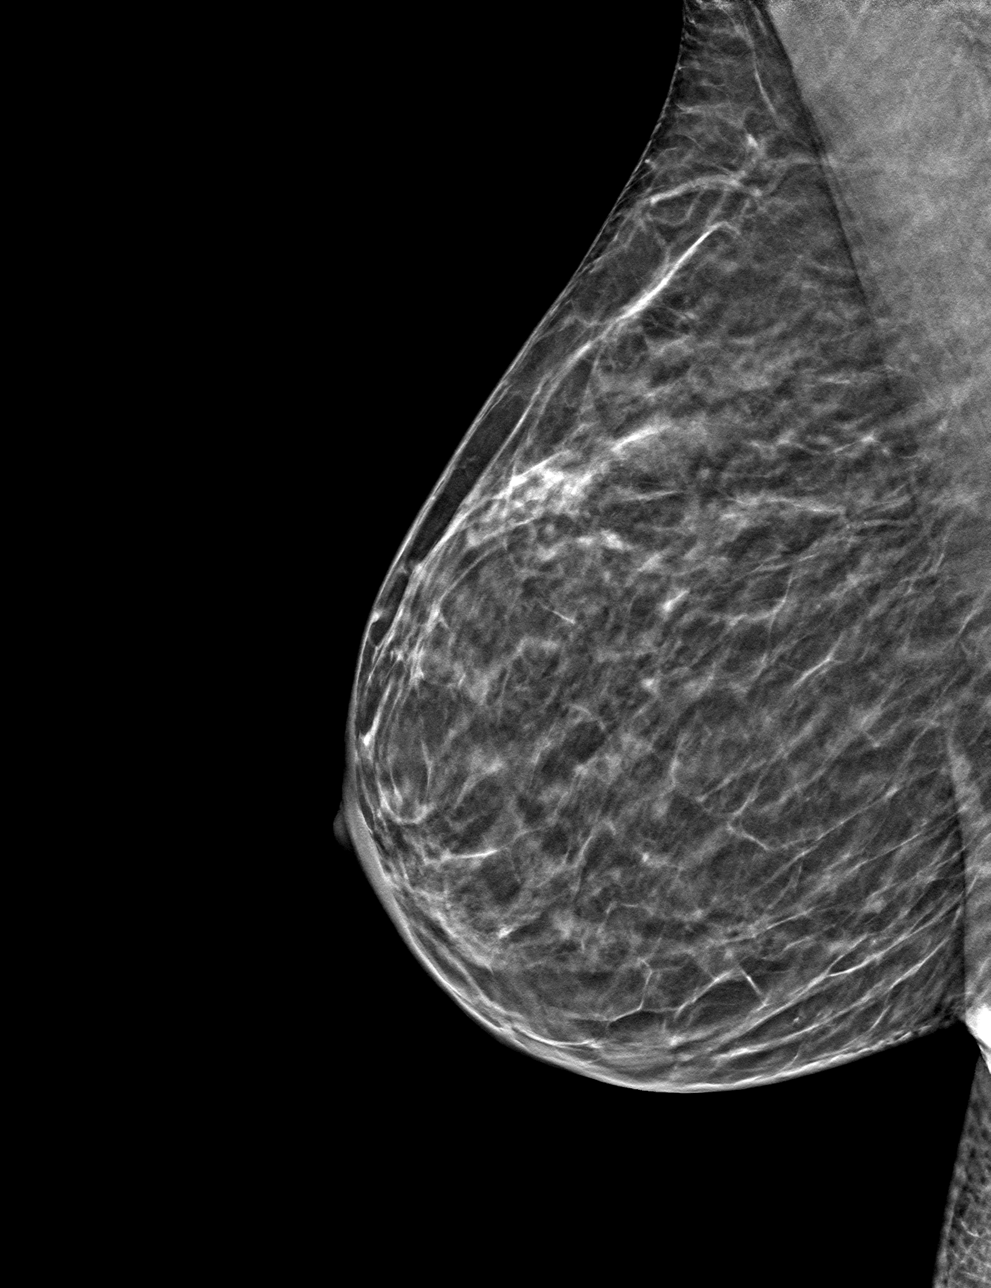

[4 of 12 positions shown; findings below may reference images not displayed]

ACR Breast Density Category b: There are scattered areas of
fibroglandular density.
FINDINGS: The area of possible distortion partly disperses on spot compression
imaging. The residual opacity is somewhat irregular, but consistent
with superimposed fibroglandular tissue, with no true architectural
distortion. There are no masses or areas of significant asymmetry.
IMPRESSION: No evidence of breast malignancy.

RECOMMENDATION:
Screening mammogram in one year.(Code:0T-H-TGP)

I have discussed the findings and recommendations with the patient.
If applicable, a reminder letter will be sent to the patient
regarding the next appointment.

BI-RADS CATEGORY  1: Negative.

## 2022-02-22 DIAGNOSIS — F4310 Post-traumatic stress disorder, unspecified: Secondary | ICD-10-CM | POA: Diagnosis not present

## 2022-02-22 DIAGNOSIS — F329 Major depressive disorder, single episode, unspecified: Secondary | ICD-10-CM | POA: Diagnosis not present

## 2022-02-22 DIAGNOSIS — F411 Generalized anxiety disorder: Secondary | ICD-10-CM | POA: Diagnosis not present

## 2022-02-26 DIAGNOSIS — F411 Generalized anxiety disorder: Secondary | ICD-10-CM | POA: Diagnosis not present

## 2022-03-09 ENCOUNTER — Other Ambulatory Visit (HOSPITAL_COMMUNITY)
Admission: RE | Admit: 2022-03-09 | Discharge: 2022-03-09 | Disposition: A | Discharge status: Home / Self Care | Payer: BC Managed Care – PPO | Source: Ambulatory Visit | Attending: Nurse Practitioner | Admitting: Nurse Practitioner

## 2022-03-09 ENCOUNTER — Other Ambulatory Visit: Payer: Self-pay | Admitting: Nurse Practitioner

## 2022-03-09 DIAGNOSIS — R87810 Cervical high risk human papillomavirus (HPV) DNA test positive: Secondary | ICD-10-CM | POA: Insufficient documentation

## 2022-03-09 DIAGNOSIS — Z01419 Encounter for gynecological examination (general) (routine) without abnormal findings: Secondary | ICD-10-CM | POA: Diagnosis not present

## 2022-03-09 DIAGNOSIS — Z113 Encounter for screening for infections with a predominantly sexual mode of transmission: Secondary | ICD-10-CM | POA: Diagnosis not present

## 2022-03-10 DIAGNOSIS — F411 Generalized anxiety disorder: Secondary | ICD-10-CM | POA: Diagnosis not present

## 2022-03-12 LAB — CYTOLOGY - PAP
Comment: NEGATIVE
Comment: NEGATIVE
Comment: NEGATIVE
Diagnosis: UNDETERMINED — AB
HPV 16: NEGATIVE
HPV 18 / 45: NEGATIVE
High risk HPV: POSITIVE — AB

## 2022-03-17 DIAGNOSIS — F411 Generalized anxiety disorder: Secondary | ICD-10-CM | POA: Diagnosis not present

## 2022-03-24 DIAGNOSIS — F411 Generalized anxiety disorder: Secondary | ICD-10-CM | POA: Diagnosis not present

## 2022-03-26 DIAGNOSIS — R87810 Cervical high risk human papillomavirus (HPV) DNA test positive: Secondary | ICD-10-CM | POA: Diagnosis not present

## 2022-03-26 DIAGNOSIS — Z3202 Encounter for pregnancy test, result negative: Secondary | ICD-10-CM | POA: Diagnosis not present

## 2022-03-26 DIAGNOSIS — R87610 Atypical squamous cells of undetermined significance on cytologic smear of cervix (ASC-US): Secondary | ICD-10-CM | POA: Diagnosis not present

## 2022-03-31 DIAGNOSIS — F411 Generalized anxiety disorder: Secondary | ICD-10-CM | POA: Diagnosis not present

## 2022-04-07 DIAGNOSIS — F411 Generalized anxiety disorder: Secondary | ICD-10-CM | POA: Diagnosis not present

## 2022-04-13 DIAGNOSIS — F411 Generalized anxiety disorder: Secondary | ICD-10-CM | POA: Diagnosis not present

## 2022-04-20 DIAGNOSIS — F411 Generalized anxiety disorder: Secondary | ICD-10-CM | POA: Diagnosis not present

## 2022-04-28 DIAGNOSIS — F411 Generalized anxiety disorder: Secondary | ICD-10-CM | POA: Diagnosis not present

## 2022-05-05 DIAGNOSIS — F411 Generalized anxiety disorder: Secondary | ICD-10-CM | POA: Diagnosis not present

## 2022-05-26 DIAGNOSIS — F411 Generalized anxiety disorder: Secondary | ICD-10-CM | POA: Diagnosis not present

## 2022-05-31 DIAGNOSIS — F329 Major depressive disorder, single episode, unspecified: Secondary | ICD-10-CM | POA: Diagnosis not present

## 2022-05-31 DIAGNOSIS — F1010 Alcohol abuse, uncomplicated: Secondary | ICD-10-CM | POA: Diagnosis not present

## 2022-05-31 DIAGNOSIS — F411 Generalized anxiety disorder: Secondary | ICD-10-CM | POA: Diagnosis not present

## 2022-05-31 DIAGNOSIS — F902 Attention-deficit hyperactivity disorder, combined type: Secondary | ICD-10-CM | POA: Diagnosis not present

## 2022-06-04 DIAGNOSIS — F411 Generalized anxiety disorder: Secondary | ICD-10-CM | POA: Diagnosis not present

## 2022-06-30 DIAGNOSIS — F411 Generalized anxiety disorder: Secondary | ICD-10-CM | POA: Diagnosis not present

## 2022-07-14 DIAGNOSIS — F329 Major depressive disorder, single episode, unspecified: Secondary | ICD-10-CM | POA: Diagnosis not present

## 2022-07-14 DIAGNOSIS — F1010 Alcohol abuse, uncomplicated: Secondary | ICD-10-CM | POA: Diagnosis not present

## 2022-07-14 DIAGNOSIS — F902 Attention-deficit hyperactivity disorder, combined type: Secondary | ICD-10-CM | POA: Diagnosis not present

## 2022-07-14 DIAGNOSIS — F411 Generalized anxiety disorder: Secondary | ICD-10-CM | POA: Diagnosis not present

## 2022-07-28 ENCOUNTER — Encounter: Payer: Self-pay | Admitting: Internal Medicine

## 2022-08-09 DIAGNOSIS — R102 Pelvic and perineal pain: Secondary | ICD-10-CM | POA: Diagnosis not present

## 2022-08-09 DIAGNOSIS — K5900 Constipation, unspecified: Secondary | ICD-10-CM | POA: Diagnosis not present

## 2022-08-09 DIAGNOSIS — N939 Abnormal uterine and vaginal bleeding, unspecified: Secondary | ICD-10-CM | POA: Diagnosis not present

## 2022-08-09 DIAGNOSIS — N949 Unspecified condition associated with female genital organs and menstrual cycle: Secondary | ICD-10-CM | POA: Diagnosis not present

## 2022-08-11 DIAGNOSIS — L578 Other skin changes due to chronic exposure to nonionizing radiation: Secondary | ICD-10-CM | POA: Diagnosis not present

## 2022-08-11 DIAGNOSIS — L821 Other seborrheic keratosis: Secondary | ICD-10-CM | POA: Diagnosis not present

## 2022-08-11 DIAGNOSIS — D229 Melanocytic nevi, unspecified: Secondary | ICD-10-CM | POA: Diagnosis not present

## 2022-08-11 DIAGNOSIS — L814 Other melanin hyperpigmentation: Secondary | ICD-10-CM | POA: Diagnosis not present

## 2022-08-16 ENCOUNTER — Other Ambulatory Visit: Payer: Self-pay | Admitting: Nurse Practitioner

## 2022-08-16 DIAGNOSIS — N939 Abnormal uterine and vaginal bleeding, unspecified: Secondary | ICD-10-CM | POA: Diagnosis not present

## 2022-08-16 DIAGNOSIS — N949 Unspecified condition associated with female genital organs and menstrual cycle: Secondary | ICD-10-CM | POA: Diagnosis not present

## 2022-08-16 DIAGNOSIS — Z1231 Encounter for screening mammogram for malignant neoplasm of breast: Secondary | ICD-10-CM

## 2022-08-26 DIAGNOSIS — Z3046 Encounter for surveillance of implantable subdermal contraceptive: Secondary | ICD-10-CM | POA: Diagnosis not present

## 2022-08-26 DIAGNOSIS — Z3202 Encounter for pregnancy test, result negative: Secondary | ICD-10-CM | POA: Diagnosis not present

## 2022-10-06 DIAGNOSIS — F329 Major depressive disorder, single episode, unspecified: Secondary | ICD-10-CM | POA: Diagnosis not present

## 2022-10-06 DIAGNOSIS — F411 Generalized anxiety disorder: Secondary | ICD-10-CM | POA: Diagnosis not present

## 2022-10-06 DIAGNOSIS — F902 Attention-deficit hyperactivity disorder, combined type: Secondary | ICD-10-CM | POA: Diagnosis not present

## 2022-10-06 DIAGNOSIS — F4310 Post-traumatic stress disorder, unspecified: Secondary | ICD-10-CM | POA: Diagnosis not present

## 2022-10-13 ENCOUNTER — Ambulatory Visit
Admission: RE | Admit: 2022-10-13 | Discharge: 2022-10-13 | Disposition: A | Discharge status: Home / Self Care | Payer: BC Managed Care – PPO | Source: Ambulatory Visit | Attending: Nurse Practitioner | Admitting: Nurse Practitioner

## 2022-10-13 DIAGNOSIS — Z1231 Encounter for screening mammogram for malignant neoplasm of breast: Secondary | ICD-10-CM | POA: Diagnosis not present

## 2022-10-19 DIAGNOSIS — N766 Ulceration of vulva: Secondary | ICD-10-CM | POA: Diagnosis not present

## 2022-10-19 DIAGNOSIS — A57 Chancroid: Secondary | ICD-10-CM | POA: Diagnosis not present

## 2022-10-25 DIAGNOSIS — A57 Chancroid: Secondary | ICD-10-CM | POA: Diagnosis not present

## 2022-10-30 DIAGNOSIS — J0140 Acute pansinusitis, unspecified: Secondary | ICD-10-CM | POA: Diagnosis not present

## 2022-10-30 DIAGNOSIS — M2550 Pain in unspecified joint: Secondary | ICD-10-CM | POA: Diagnosis not present

## 2022-11-23 DIAGNOSIS — J309 Allergic rhinitis, unspecified: Secondary | ICD-10-CM | POA: Diagnosis not present

## 2022-11-23 DIAGNOSIS — H938X3 Other specified disorders of ear, bilateral: Secondary | ICD-10-CM | POA: Diagnosis not present

## 2022-12-15 DIAGNOSIS — F4310 Post-traumatic stress disorder, unspecified: Secondary | ICD-10-CM | POA: Diagnosis not present

## 2022-12-15 DIAGNOSIS — F411 Generalized anxiety disorder: Secondary | ICD-10-CM | POA: Diagnosis not present

## 2022-12-15 DIAGNOSIS — F902 Attention-deficit hyperactivity disorder, combined type: Secondary | ICD-10-CM | POA: Diagnosis not present

## 2022-12-15 DIAGNOSIS — F339 Major depressive disorder, recurrent, unspecified: Secondary | ICD-10-CM | POA: Diagnosis not present

## 2022-12-31 DIAGNOSIS — M5432 Sciatica, left side: Secondary | ICD-10-CM | POA: Diagnosis not present

## 2022-12-31 DIAGNOSIS — M9903 Segmental and somatic dysfunction of lumbar region: Secondary | ICD-10-CM | POA: Diagnosis not present

## 2022-12-31 DIAGNOSIS — M9904 Segmental and somatic dysfunction of sacral region: Secondary | ICD-10-CM | POA: Diagnosis not present

## 2022-12-31 DIAGNOSIS — M9905 Segmental and somatic dysfunction of pelvic region: Secondary | ICD-10-CM | POA: Diagnosis not present

## 2023-01-07 DIAGNOSIS — M9906 Segmental and somatic dysfunction of lower extremity: Secondary | ICD-10-CM | POA: Diagnosis not present

## 2023-01-07 DIAGNOSIS — M9904 Segmental and somatic dysfunction of sacral region: Secondary | ICD-10-CM | POA: Diagnosis not present

## 2023-01-07 DIAGNOSIS — M5432 Sciatica, left side: Secondary | ICD-10-CM | POA: Diagnosis not present

## 2023-01-07 DIAGNOSIS — M9903 Segmental and somatic dysfunction of lumbar region: Secondary | ICD-10-CM | POA: Diagnosis not present

## 2023-01-07 DIAGNOSIS — M9905 Segmental and somatic dysfunction of pelvic region: Secondary | ICD-10-CM | POA: Diagnosis not present

## 2023-01-14 DIAGNOSIS — M9904 Segmental and somatic dysfunction of sacral region: Secondary | ICD-10-CM | POA: Diagnosis not present

## 2023-01-14 DIAGNOSIS — M9906 Segmental and somatic dysfunction of lower extremity: Secondary | ICD-10-CM | POA: Diagnosis not present

## 2023-01-14 DIAGNOSIS — M5432 Sciatica, left side: Secondary | ICD-10-CM | POA: Diagnosis not present

## 2023-01-14 DIAGNOSIS — M9903 Segmental and somatic dysfunction of lumbar region: Secondary | ICD-10-CM | POA: Diagnosis not present

## 2023-01-14 DIAGNOSIS — M9905 Segmental and somatic dysfunction of pelvic region: Secondary | ICD-10-CM | POA: Diagnosis not present

## 2023-01-24 DIAGNOSIS — J029 Acute pharyngitis, unspecified: Secondary | ICD-10-CM | POA: Diagnosis not present

## 2023-01-28 DIAGNOSIS — M9903 Segmental and somatic dysfunction of lumbar region: Secondary | ICD-10-CM | POA: Diagnosis not present

## 2023-01-28 DIAGNOSIS — M9905 Segmental and somatic dysfunction of pelvic region: Secondary | ICD-10-CM | POA: Diagnosis not present

## 2023-01-28 DIAGNOSIS — M5432 Sciatica, left side: Secondary | ICD-10-CM | POA: Diagnosis not present

## 2023-01-28 DIAGNOSIS — M9906 Segmental and somatic dysfunction of lower extremity: Secondary | ICD-10-CM | POA: Diagnosis not present

## 2023-01-28 DIAGNOSIS — M9904 Segmental and somatic dysfunction of sacral region: Secondary | ICD-10-CM | POA: Diagnosis not present

## 2023-01-31 DIAGNOSIS — B829 Intestinal parasitism, unspecified: Secondary | ICD-10-CM | POA: Diagnosis not present

## 2023-01-31 DIAGNOSIS — Z0000 Encounter for general adult medical examination without abnormal findings: Secondary | ICD-10-CM | POA: Diagnosis not present

## 2023-01-31 DIAGNOSIS — Z13220 Encounter for screening for lipoid disorders: Secondary | ICD-10-CM | POA: Diagnosis not present

## 2023-02-04 DIAGNOSIS — B829 Intestinal parasitism, unspecified: Secondary | ICD-10-CM | POA: Diagnosis not present

## 2023-02-11 DIAGNOSIS — M9906 Segmental and somatic dysfunction of lower extremity: Secondary | ICD-10-CM | POA: Diagnosis not present

## 2023-02-11 DIAGNOSIS — M5432 Sciatica, left side: Secondary | ICD-10-CM | POA: Diagnosis not present

## 2023-02-11 DIAGNOSIS — M9904 Segmental and somatic dysfunction of sacral region: Secondary | ICD-10-CM | POA: Diagnosis not present

## 2023-02-11 DIAGNOSIS — M9903 Segmental and somatic dysfunction of lumbar region: Secondary | ICD-10-CM | POA: Diagnosis not present

## 2023-02-11 DIAGNOSIS — M9905 Segmental and somatic dysfunction of pelvic region: Secondary | ICD-10-CM | POA: Diagnosis not present

## 2023-03-02 DIAGNOSIS — M9906 Segmental and somatic dysfunction of lower extremity: Secondary | ICD-10-CM | POA: Diagnosis not present

## 2023-03-02 DIAGNOSIS — M9904 Segmental and somatic dysfunction of sacral region: Secondary | ICD-10-CM | POA: Diagnosis not present

## 2023-03-02 DIAGNOSIS — M9903 Segmental and somatic dysfunction of lumbar region: Secondary | ICD-10-CM | POA: Diagnosis not present

## 2023-03-02 DIAGNOSIS — M9905 Segmental and somatic dysfunction of pelvic region: Secondary | ICD-10-CM | POA: Diagnosis not present

## 2023-03-02 DIAGNOSIS — M5432 Sciatica, left side: Secondary | ICD-10-CM | POA: Diagnosis not present

## 2023-03-10 DIAGNOSIS — F4310 Post-traumatic stress disorder, unspecified: Secondary | ICD-10-CM | POA: Diagnosis not present

## 2023-03-10 DIAGNOSIS — F339 Major depressive disorder, recurrent, unspecified: Secondary | ICD-10-CM | POA: Diagnosis not present

## 2023-03-10 DIAGNOSIS — F411 Generalized anxiety disorder: Secondary | ICD-10-CM | POA: Diagnosis not present

## 2023-03-10 DIAGNOSIS — F902 Attention-deficit hyperactivity disorder, combined type: Secondary | ICD-10-CM | POA: Diagnosis not present

## 2023-03-11 ENCOUNTER — Other Ambulatory Visit (HOSPITAL_COMMUNITY)
Admission: RE | Admit: 2023-03-11 | Discharge: 2023-03-11 | Disposition: A | Discharge status: Home / Self Care | Payer: BC Managed Care – PPO | Source: Ambulatory Visit | Attending: Nurse Practitioner | Admitting: Nurse Practitioner

## 2023-03-11 ENCOUNTER — Other Ambulatory Visit: Payer: Self-pay | Admitting: Nurse Practitioner

## 2023-03-11 DIAGNOSIS — Z01419 Encounter for gynecological examination (general) (routine) without abnormal findings: Secondary | ICD-10-CM | POA: Diagnosis not present

## 2023-03-11 DIAGNOSIS — N898 Other specified noninflammatory disorders of vagina: Secondary | ICD-10-CM | POA: Diagnosis not present

## 2023-03-11 DIAGNOSIS — R3915 Urgency of urination: Secondary | ICD-10-CM | POA: Diagnosis not present

## 2023-03-11 DIAGNOSIS — Z113 Encounter for screening for infections with a predominantly sexual mode of transmission: Secondary | ICD-10-CM | POA: Diagnosis not present

## 2023-03-11 DIAGNOSIS — N644 Mastodynia: Secondary | ICD-10-CM | POA: Diagnosis not present

## 2023-03-11 DIAGNOSIS — R8290 Unspecified abnormal findings in urine: Secondary | ICD-10-CM | POA: Diagnosis not present

## 2023-03-11 DIAGNOSIS — Z124 Encounter for screening for malignant neoplasm of cervix: Secondary | ICD-10-CM | POA: Diagnosis not present

## 2023-03-16 DIAGNOSIS — R1909 Other intra-abdominal and pelvic swelling, mass and lump: Secondary | ICD-10-CM | POA: Diagnosis not present

## 2023-03-16 LAB — CYTOLOGY - PAP
Comment: NEGATIVE
Comment: NEGATIVE
Comment: NEGATIVE
Diagnosis: UNDETERMINED — AB
HPV 16: NEGATIVE
HPV 18 / 45: POSITIVE — AB
High risk HPV: POSITIVE — AB

## 2023-03-18 ENCOUNTER — Other Ambulatory Visit: Payer: Self-pay | Admitting: Family Medicine

## 2023-03-18 DIAGNOSIS — R1909 Other intra-abdominal and pelvic swelling, mass and lump: Secondary | ICD-10-CM

## 2023-03-24 ENCOUNTER — Ambulatory Visit
Admission: RE | Admit: 2023-03-24 | Discharge: 2023-03-24 | Disposition: A | Discharge status: Home / Self Care | Payer: BC Managed Care – PPO | Source: Ambulatory Visit | Attending: Family Medicine | Admitting: Family Medicine

## 2023-03-24 DIAGNOSIS — R229 Localized swelling, mass and lump, unspecified: Secondary | ICD-10-CM | POA: Diagnosis not present

## 2023-03-24 DIAGNOSIS — R1909 Other intra-abdominal and pelvic swelling, mass and lump: Secondary | ICD-10-CM

## 2023-03-28 DIAGNOSIS — Z3202 Encounter for pregnancy test, result negative: Secondary | ICD-10-CM | POA: Diagnosis not present

## 2023-03-28 DIAGNOSIS — N870 Mild cervical dysplasia: Secondary | ICD-10-CM | POA: Diagnosis not present

## 2023-04-08 ENCOUNTER — Other Ambulatory Visit: Payer: Self-pay | Admitting: Family Medicine

## 2023-04-08 ENCOUNTER — Ambulatory Visit: Admission: RE | Admit: 2023-04-08 | Payer: BC Managed Care – PPO | Source: Ambulatory Visit

## 2023-04-08 DIAGNOSIS — M25551 Pain in right hip: Secondary | ICD-10-CM | POA: Diagnosis not present

## 2023-04-08 DIAGNOSIS — R1031 Right lower quadrant pain: Secondary | ICD-10-CM

## 2023-05-31 DIAGNOSIS — K5909 Other constipation: Secondary | ICD-10-CM | POA: Diagnosis not present

## 2023-05-31 DIAGNOSIS — R1084 Generalized abdominal pain: Secondary | ICD-10-CM | POA: Diagnosis not present

## 2023-05-31 DIAGNOSIS — R140 Abdominal distension (gaseous): Secondary | ICD-10-CM | POA: Diagnosis not present

## 2023-05-31 DIAGNOSIS — R1030 Lower abdominal pain, unspecified: Secondary | ICD-10-CM | POA: Diagnosis not present

## 2023-08-01 DIAGNOSIS — M5432 Sciatica, left side: Secondary | ICD-10-CM | POA: Diagnosis not present

## 2023-08-01 DIAGNOSIS — M9903 Segmental and somatic dysfunction of lumbar region: Secondary | ICD-10-CM | POA: Diagnosis not present

## 2023-08-01 DIAGNOSIS — M9905 Segmental and somatic dysfunction of pelvic region: Secondary | ICD-10-CM | POA: Diagnosis not present

## 2023-08-01 DIAGNOSIS — M9904 Segmental and somatic dysfunction of sacral region: Secondary | ICD-10-CM | POA: Diagnosis not present

## 2023-08-08 DIAGNOSIS — D229 Melanocytic nevi, unspecified: Secondary | ICD-10-CM | POA: Diagnosis not present

## 2023-08-08 DIAGNOSIS — L821 Other seborrheic keratosis: Secondary | ICD-10-CM | POA: Diagnosis not present

## 2023-08-08 DIAGNOSIS — L570 Actinic keratosis: Secondary | ICD-10-CM | POA: Diagnosis not present

## 2023-08-08 DIAGNOSIS — L578 Other skin changes due to chronic exposure to nonionizing radiation: Secondary | ICD-10-CM | POA: Diagnosis not present

## 2023-08-10 ENCOUNTER — Ambulatory Visit
Admission: RE | Admit: 2023-08-10 | Discharge: 2023-08-10 | Disposition: A | Discharge status: Home / Self Care | Payer: BC Managed Care – PPO | Source: Ambulatory Visit | Attending: Internal Medicine | Admitting: Internal Medicine

## 2023-08-10 VITALS — BP 111/79 | HR 75 | Temp 97.6°F | Resp 16

## 2023-08-10 DIAGNOSIS — I7300 Raynaud's syndrome without gangrene: Secondary | ICD-10-CM | POA: Diagnosis not present

## 2023-08-10 NOTE — ED Triage Notes (Addendum)
Pt c/o intermittent cold and color change to alternating fingers/both hands x 1 year-worse x -4 months-also c/o intermittent joint pain and numbness to feet and hands with same timeline-NAD-steady gait

## 2023-08-10 NOTE — ED Provider Notes (Signed)
[facility]  Note:  This document was prepared using Conservation officer, historic buildings and may include unintentional dictation errors.  MRN: [id number] DOB: [birth year]  Subjective:   Shelby Morris is a 42 y.o. female presenting for 1 year history of persistent and worsening discoloration, numbness, cold spells of the fingers of both of her hands.  Symptoms have been worse in the past 4 months, experiences symptoms almost daily when touching anything cold for instance like a bag out of a freezer.  Symptoms can persist for several minutes or less than an hour.  She does have a PCP but has not sought workup with them.  No current facility-administered medications for this encounter.  Current Outpatient Medications:    Ascorbic Acid (VITAMIN C PO), Take by mouth daily as needed., Disp: , Rfl:    citalopram (CELEXA) 20 MG tablet, Take 40 mg by mouth daily., Disp: , Rfl:    MAGNESIUM CARBONATE PO, Take by mouth., Disp: , Rfl:    Multiple Vitamin (MULTIVITAMIN) tablet, Take 1 tablet by mouth daily., Disp: , Rfl:    Probiotic Product (PROBIOTIC DAILY PO), Take by mouth., Disp: , Rfl:    Rimegepant Sulfate (NURTEC) 75 MG TBDP, Take 75 mg by mouth once as needed for up to 1 dose. May repeat once after 2 hours (Patient not taking: Reported on 07/22/2021), Disp: 10 tablet, Rfl: 3   topiramate (TOPAMAX) 50 MG tablet, Take 50 mg by mouth 2 (two) times daily., Disp: , Rfl:    Allergies  Allergen Reactions   Flagyl [Metronidazole] Rash    Past Medical History:  Diagnosis Date   Anxiety    History of abnormal Pap smear      Past Surgical History:  Procedure Laterality Date   CESAREAN SECTION      Family History  Problem Relation Age of Onset   Hypertension Mother    Supraventricular tachycardia Mother    Diabetes Father    Hypertension Father    Obesity Brother    Hypertension Maternal Grandmother    Hypertension Maternal Grandfather     Social History    Tobacco Use   Smoking status: Former    Current packs/day: 0.00    Average packs/day: 0.5 packs/day for 15.0 years (7.5 ttl pk-yrs)    Types: Cigarettes    Start date: 10/01/1997    Quit date: 10/01/2012    Years since quitting: 10.8   Smokeless tobacco: Never  Vaping Use   Vaping status: Former  Substance Use Topics   Alcohol use: No   Drug use: No    ROS   Objective:   Vitals: BP 111/79 (BP Location: Right Arm)   Pulse 75   Temp 97.6 F (36.4 C) (Oral)   Resp 16   SpO2 99%   Physical Exam Constitutional:      General: She is not in acute distress.    Appearance: Normal appearance. She is well-developed. She is not ill-appearing, toxic-appearing or diaphoretic.  HENT:     Head: Normocephalic and atraumatic.     Nose: Nose normal.     Mouth/Throat:     Mouth: Mucous membranes are moist.  Eyes:     General: No scleral icterus.       Right eye: No discharge.        Left eye: No discharge.     Extraocular Movements: Extraocular movements intact.  Cardiovascular:     Rate and Rhythm: Normal rate.  Pulmonary:  Effort: Pulmonary effort is normal.  Musculoskeletal:       Arms:  Skin:    General: Skin is warm and dry.  Neurological:     General: No focal deficit present.     Mental Status: She is alert and oriented to person, place, and time.  Psychiatric:        Mood and Affect: Mood normal.        Behavior: Behavior normal.     Assessment and Plan :   PDMP not reviewed this encounter.  1. Raynaud's phenomenon without gangrene    Had a discussion about Raynaud's phenomenon.  Low suspicion for secondary complication.  Patient requested consideration for workup for secondary Raynaud's.  Unfortunately, were not able to do so through an urgent care clinic.  Advised that she establish care with a new PCP as she has had difficulties with her previous PCP.  I did advise patient that I cannot refer her to rheumatology.  However, she can revisit this pending  any kind of workup that she gets with a new PCP.  Offered amlodipine but patient declined for now.   Wallis Bamberg, PA-C 08/10/23 1106

## 2023-08-15 DIAGNOSIS — M9904 Segmental and somatic dysfunction of sacral region: Secondary | ICD-10-CM | POA: Diagnosis not present

## 2023-08-15 DIAGNOSIS — M9906 Segmental and somatic dysfunction of lower extremity: Secondary | ICD-10-CM | POA: Diagnosis not present

## 2023-08-15 DIAGNOSIS — M5431 Sciatica, right side: Secondary | ICD-10-CM | POA: Diagnosis not present

## 2023-08-15 DIAGNOSIS — M9903 Segmental and somatic dysfunction of lumbar region: Secondary | ICD-10-CM | POA: Diagnosis not present

## 2023-08-15 DIAGNOSIS — M9905 Segmental and somatic dysfunction of pelvic region: Secondary | ICD-10-CM | POA: Diagnosis not present

## 2023-08-29 ENCOUNTER — Encounter: Payer: Self-pay | Admitting: Nurse Practitioner

## 2023-08-29 ENCOUNTER — Ambulatory Visit (INDEPENDENT_AMBULATORY_CARE_PROVIDER_SITE_OTHER): Payer: BC Managed Care – PPO | Admitting: Nurse Practitioner

## 2023-08-29 VITALS — BP 102/68 | HR 64 | Temp 97.7°F | Ht 62.0 in | Wt 138.4 lb

## 2023-08-29 DIAGNOSIS — M9906 Segmental and somatic dysfunction of lower extremity: Secondary | ICD-10-CM | POA: Diagnosis not present

## 2023-08-29 DIAGNOSIS — I7300 Raynaud's syndrome without gangrene: Secondary | ICD-10-CM | POA: Insufficient documentation

## 2023-08-29 DIAGNOSIS — M9905 Segmental and somatic dysfunction of pelvic region: Secondary | ICD-10-CM | POA: Diagnosis not present

## 2023-08-29 DIAGNOSIS — M9904 Segmental and somatic dysfunction of sacral region: Secondary | ICD-10-CM | POA: Diagnosis not present

## 2023-08-29 DIAGNOSIS — M9903 Segmental and somatic dysfunction of lumbar region: Secondary | ICD-10-CM | POA: Diagnosis not present

## 2023-08-29 DIAGNOSIS — R1031 Right lower quadrant pain: Secondary | ICD-10-CM | POA: Insufficient documentation

## 2023-08-29 DIAGNOSIS — E559 Vitamin D deficiency, unspecified: Secondary | ICD-10-CM | POA: Diagnosis not present

## 2023-08-29 DIAGNOSIS — R5382 Chronic fatigue, unspecified: Secondary | ICD-10-CM | POA: Diagnosis not present

## 2023-08-29 DIAGNOSIS — M5431 Sciatica, right side: Secondary | ICD-10-CM | POA: Diagnosis not present

## 2023-08-29 DIAGNOSIS — F419 Anxiety disorder, unspecified: Secondary | ICD-10-CM | POA: Insufficient documentation

## 2023-08-29 NOTE — Assessment & Plan Note (Signed)
She reports chronic fatigue and a tendency to take long naps during the day, with no improvement after discontinuing citalopram, Prozac, and topiramate. A complete blood count, metabolic panel, thyroid function tests, and vitamin levels will be ordered to investigate potential causes.

## 2023-08-29 NOTE — Progress Notes (Signed)
New Patient Visit  BP 102/68 (BP Location: Left Arm, Cuff Size: Small)   Pulse 64   Temp 97.7 F (36.5 C)   Ht 5\' 2"  (1.575 m)   Wt 138 lb 6.4 oz (62.8 kg)   SpO2 100%   BMI 25.31 kg/m    Subjective:    Patient ID: Franne Grip, female    DOB: Feb 04, 1981, 42 y.o.   MRN: [id number]  CC: Chief Complaint  Patient presents with   Establish Care    NP. Est. Care, concerns with raynauds flares daily    HPI: RONIAH PAGUIO is a 42 y.o. female presents for new patient visit to establish care.  Introduced to Publishing rights manager role and practice setting.  All questions answered.  Discussed provider/patient relationship and expectations.  Discussed the use of AI scribe software for clinical note transcription with the patient, who gave verbal consent to proceed.  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 She reports experiencing fatigue for several years, characterized by a need for frequent, long naps throughout the day, and still feeling tired upon waking. She has a history of anxiety and depression, previously managed with citalopram, Prozac, and topiramate. However, she decided to stop these medications over the summer and is currently only on Nexplanon. She reports that her anxiety is manageable, but she has noticed an increase in joint stiffness throughout the day.  Khaniya's primary concern is her symptoms of Raynaud's disease, which have been occurring daily. She experiences loss of feeling and color in her fingertips, affecting either hand and usually two to three fingers at a time. She also reports that her arms or legs often fall asleep while she is sleeping, and she has to be careful about her sleeping position. She has noticed a sensation of heaviness or numbness in her feet when lying flat in bed.  In addition to these symptoms, Kishara has been experiencing  gastrointestinal issues and has an appointment with a gastroenterologist. She describes occasional pain on her right side and fluctuating constipation. She also mentions some hip and back pain, and a hard nodule in her hip crease that was identified as a normal appearing lymph node on ultrasound.        08/29/2023    3:05 PM 01/12/2021    1:59 PM  Depression screen PHQ 2/9  Decreased Interest  3  Down, Depressed, Hopeless 1 0  PHQ - 2 Score 1 3  Altered sleeping 3 3  Tired, decreased energy 3 3  Change in appetite 2 0  Feeling bad or failure about yourself  0 0  Trouble concentrating 1 2  Moving slowly or fidgety/restless 0 0  Suicidal thoughts 1 0  PHQ-9 Score 11 11  Difficult doing work/chores Somewhat difficult Somewhat difficult      08/29/2023    3:05 PM  GAD 7 : Generalized Anxiety Score  Nervous, Anxious, on Edge 2  Control/stop worrying 0  Worry too much - different things 0  Trouble relaxing 2  Restless 0  Easily annoyed or irritable 2  Afraid - awful might happen 1  Total GAD 7 Score 7  Anxiety Difficulty Somewhat difficult   Past Medical History:  Diagnosis Date   Anxiety    History of abnormal Pap smear    Raynaud's disease     Past Surgical History:  Procedure Laterality Date   CESAREAN SECTION  Family History  Problem Relation Age of Onset   Hypertension Mother    Supraventricular tachycardia Mother    Diabetes Father    Hypertension Father    Obesity Brother    Hypertension Maternal Grandmother    Hypertension Maternal Grandfather      Social History   Tobacco Use   Smoking status: Former    Current packs/day: 0.00    Average packs/day: 0.5 packs/day for 15.0 years (7.5 ttl pk-yrs)    Types: Cigarettes    Start date: 10/01/1997    Quit date: 10/01/2012    Years since quitting: 10.9   Smokeless tobacco: Never  Vaping Use   Vaping status: Former  Substance Use Topics   Alcohol use: No   Drug use: No    Current Outpatient  Medications on File Prior to Visit  Medication Sig Dispense Refill   Multiple Vitamin (MULTIVITAMIN) tablet Take 1 tablet by mouth daily.     Probiotic Product (PROBIOTIC DAILY PO) Take by mouth.     No current facility-administered medications on file prior to visit.     Review of Systems  Constitutional:  Positive for fatigue. Negative for fever.  HENT: Negative.    Respiratory: Negative.    Cardiovascular: Negative.   Gastrointestinal:  Positive for abdominal pain (right side, hip, and back) and constipation (in waves).  Genitourinary: Negative.   Musculoskeletal:  Positive for arthralgias (and stiffness).  Skin: Negative.   Neurological: Negative.   Psychiatric/Behavioral:  The patient is nervous/anxious.       Objective:    BP 102/68 (BP Location: Left Arm, Cuff Size: Small)   Pulse 64   Temp 97.7 F (36.5 C)   Ht 5\' 2"  (1.575 m)   Wt 138 lb 6.4 oz (62.8 kg)   SpO2 100%   BMI 25.31 kg/m   Wt Readings from Last 3 Encounters:  08/29/23 138 lb 6.4 oz (62.8 kg)  07/22/21 126 lb (57.2 kg)  04/09/21 131 lb 3.2 oz (59.5 kg)    BP Readings from Last 3 Encounters:  08/29/23 102/68  08/10/23 111/79  07/22/21 99/66    Physical Exam Vitals and nursing note reviewed.  Constitutional:      General: She is not in acute distress.    Appearance: Normal appearance.  HENT:     Head: Normocephalic and atraumatic.     Right Ear: Tympanic membrane, ear canal and external ear normal.     Left Ear: Tympanic membrane, ear canal and external ear normal.  Eyes:     Conjunctiva/sclera: Conjunctivae normal.  Cardiovascular:     Rate and Rhythm: Normal rate and regular rhythm.     Pulses: Normal pulses.     Heart sounds: Normal heart sounds.  Pulmonary:     Effort: Pulmonary effort is normal.     Breath sounds: Normal breath sounds.  Abdominal:     Palpations: Abdomen is soft.     Tenderness: There is no abdominal tenderness.  Musculoskeletal:        General: Normal range of  motion.     Cervical back: Normal range of motion and neck supple.     Right lower leg: No edema.     Left lower leg: No edema.  Lymphadenopathy:     Cervical: No cervical adenopathy.  Skin:    General: Skin is warm and dry.  Neurological:     General: No focal deficit present.     Mental Status: She is alert and oriented to person, place,  and time.     Cranial Nerves: No cranial nerve deficit.     Coordination: Coordination normal.     Gait: Gait normal.  Psychiatric:        Mood and Affect: Mood normal.        Behavior: Behavior normal.        Thought Content: Thought content normal.        Judgment: Judgment normal.        Assessment & Plan:   Problem List Items Addressed This Visit       Cardiovascular and Mediastinum   Raynaud's disease without gangrene - Primary   She experiences daily episodes of color and sensation changes in the fingers of both hands, with no similar symptoms in the toes, preferring lifestyle modifications for management. We will advise limiting caffeine, avoiding smoking, and keeping hands warm, such as wearing gloves in cold environments. An autoimmune panel will be ordered to rule out associated conditions.      Relevant Orders   ANA w/Reflex   Rheumatoid Factor   Sedimentation rate   C-reactive protein     Other   Chronic fatigue   She reports chronic fatigue and a tendency to take long naps during the day, with no improvement after discontinuing citalopram, Prozac, and topiramate. A complete blood count, metabolic panel, thyroid function tests, and vitamin levels will be ordered to investigate potential causes.      Relevant Orders   CBC with Differential/Platelet   Comprehensive metabolic panel   Vitamin B12   VITAMIN D 25 Hydroxy (Vit-D Deficiency, Fractures)   TSH   Iron, TIBC and Ferritin Panel   ANA w/Reflex   Rheumatoid Factor   Sedimentation rate   C-reactive protein   Vitamin D deficiency   Check vitamin d levels today and  treat based on results.       Relevant Orders   VITAMIN D 25 Hydroxy (Vit-D Deficiency, Fractures)   Anxiety   She has a history of anxiety previously managed with citalopram, Prozac, and topiramate, but is currently off all medications, managing symptoms with non-pharmacological methods and not seeking medication at this time. Continuation of non-pharmacological management strategies will be encouraged.      Right lower quadrant abdominal pain   No red flags on exam. She reports right-sided abdominal pain with associated constipation and has a scheduled appointment with a gastroenterologist. A follow-up after the gastroenterology consultation is recommended.         Follow up plan: Return in about 1 year (around 08/28/2024) for CPE.  Arlanda Shiplett A Maize Brittingham

## 2023-08-29 NOTE — Assessment & Plan Note (Signed)
She experiences daily episodes of color and sensation changes in the fingers of both hands, with no similar symptoms in the toes, preferring lifestyle modifications for management. We will advise limiting caffeine, avoiding smoking, and keeping hands warm, such as wearing gloves in cold environments. An autoimmune panel will be ordered to rule out associated conditions.

## 2023-08-29 NOTE — Patient Instructions (Signed)
It was great to see you!  We are checking your labs today and will let you know the results via mychart/phone.   Let's follow-up in 1 year, sooner if you have concerns.  If a referral was placed today, you will be contacted for an appointment. Please note that routine referrals can sometimes take up to 3-4 weeks to process. Please call our office if you haven't heard anything after this time frame.  Take care,  Naina Sleeper, NP  

## 2023-08-29 NOTE — Assessment & Plan Note (Signed)
Check vitamin d levels today and treat based on results.

## 2023-08-29 NOTE — Assessment & Plan Note (Signed)
No red flags on exam. She reports right-sided abdominal pain with associated constipation and has a scheduled appointment with a gastroenterologist. A follow-up after the gastroenterology consultation is recommended.

## 2023-08-29 NOTE — Assessment & Plan Note (Signed)
She has a history of anxiety previously managed with citalopram, Prozac, and topiramate, but is currently off all medications, managing symptoms with non-pharmacological methods and not seeking medication at this time. Continuation of non-pharmacological management strategies will be encouraged.

## 2023-08-30 LAB — CBC WITH DIFFERENTIAL/PLATELET
Basophils Absolute: 0.1 10*3/uL (ref 0.0–0.1)
Basophils Relative: 1.1 % (ref 0.0–3.0)
Eosinophils Absolute: 0.1 10*3/uL (ref 0.0–0.7)
Eosinophils Relative: 2 % (ref 0.0–5.0)
HCT: 40.9 % (ref 36.0–46.0)
Hemoglobin: 13.3 g/dL (ref 12.0–15.0)
Lymphocytes Relative: 39.5 % (ref 12.0–46.0)
Lymphs Abs: 1.9 10*3/uL (ref 0.7–4.0)
MCHC: 32.6 g/dL (ref 30.0–36.0)
MCV: 92.3 fL (ref 78.0–100.0)
Monocytes Absolute: 0.3 10*3/uL (ref 0.1–1.0)
Monocytes Relative: 6.3 % (ref 3.0–12.0)
Neutro Abs: 2.4 10*3/uL (ref 1.4–7.7)
Neutrophils Relative %: 51.1 % (ref 43.0–77.0)
Platelets: 216 10*3/uL (ref 150.0–400.0)
RBC: 4.43 Mil/uL (ref 3.87–5.11)
RDW: 15.2 % (ref 11.5–15.5)
WBC: 4.8 10*3/uL (ref 4.0–10.5)

## 2023-08-30 LAB — COMPREHENSIVE METABOLIC PANEL
ALT: 11 U/L (ref 0–35)
AST: 14 U/L (ref 0–37)
Albumin: 4.2 g/dL (ref 3.5–5.2)
Alkaline Phosphatase: 35 U/L — ABNORMAL LOW (ref 39–117)
BUN: 23 mg/dL (ref 6–23)
CO2: 27 meq/L (ref 19–32)
Calcium: 8.8 mg/dL (ref 8.4–10.5)
Chloride: 105 meq/L (ref 96–112)
Creatinine, Ser: 0.95 mg/dL (ref 0.40–1.20)
GFR: 73.99 mL/min (ref >60.00)
Glucose, Bld: 81 mg/dL (ref 70–99)
Potassium: 3.9 meq/L (ref 3.5–5.1)
Sodium: 140 meq/L (ref 135–145)
Total Bilirubin: 0.4 mg/dL (ref 0.2–1.2)
Total Protein: 6.3 g/dL (ref 6.0–8.3)

## 2023-08-30 LAB — IRON,TIBC AND FERRITIN PANEL
%SAT: 34 % (ref 16–45)
Ferritin: 16 ng/mL (ref 16–232)
Iron: 114 ug/dL (ref 40–190)
TIBC: 331 ug/dL (ref 250–450)

## 2023-08-30 LAB — ANA W/REFLEX: Anti Nuclear Antibody (ANA): NEGATIVE

## 2023-08-30 LAB — RHEUMATOID FACTOR: Rheumatoid fact SerPl-aCnc: <10 [IU]/mL (ref <14)

## 2023-08-30 LAB — C-REACTIVE PROTEIN: CRP: <1 mg/dL (ref 0.5–20.0)

## 2023-08-30 LAB — VITAMIN D 25 HYDROXY (VIT D DEFICIENCY, FRACTURES): VITD: 20.92 ng/mL — ABNORMAL LOW (ref 30.00–100.00)

## 2023-08-30 LAB — TSH: TSH: 1.69 u[IU]/mL (ref 0.35–5.50)

## 2023-08-30 LAB — VITAMIN B12: Vitamin B-12: 151 pg/mL — ABNORMAL LOW (ref 211–911)

## 2023-08-30 LAB — SEDIMENTATION RATE: Sed Rate: 5 mm/h (ref 0–20)

## 2023-08-31 DIAGNOSIS — K5900 Constipation, unspecified: Secondary | ICD-10-CM | POA: Diagnosis not present

## 2023-08-31 DIAGNOSIS — R140 Abdominal distension (gaseous): Secondary | ICD-10-CM | POA: Diagnosis not present

## 2023-09-05 ENCOUNTER — Encounter: Payer: Self-pay | Admitting: Nurse Practitioner

## 2023-09-12 DIAGNOSIS — M9904 Segmental and somatic dysfunction of sacral region: Secondary | ICD-10-CM | POA: Diagnosis not present

## 2023-09-12 DIAGNOSIS — M9903 Segmental and somatic dysfunction of lumbar region: Secondary | ICD-10-CM | POA: Diagnosis not present

## 2023-09-12 DIAGNOSIS — M5431 Sciatica, right side: Secondary | ICD-10-CM | POA: Diagnosis not present

## 2023-09-12 DIAGNOSIS — M9905 Segmental and somatic dysfunction of pelvic region: Secondary | ICD-10-CM | POA: Diagnosis not present

## 2023-09-12 DIAGNOSIS — M9906 Segmental and somatic dysfunction of lower extremity: Secondary | ICD-10-CM | POA: Diagnosis not present

## 2023-09-19 ENCOUNTER — Ambulatory Visit: Payer: BC Managed Care – PPO | Admitting: Physician Assistant

## 2023-10-10 DIAGNOSIS — F4312 Post-traumatic stress disorder, chronic: Secondary | ICD-10-CM | POA: Diagnosis not present

## 2023-10-20 DIAGNOSIS — Z3046 Encounter for surveillance of implantable subdermal contraceptive: Secondary | ICD-10-CM | POA: Diagnosis not present

## 2023-10-20 DIAGNOSIS — M9906 Segmental and somatic dysfunction of lower extremity: Secondary | ICD-10-CM | POA: Diagnosis not present

## 2023-10-20 DIAGNOSIS — M9905 Segmental and somatic dysfunction of pelvic region: Secondary | ICD-10-CM | POA: Diagnosis not present

## 2023-10-20 DIAGNOSIS — M9904 Segmental and somatic dysfunction of sacral region: Secondary | ICD-10-CM | POA: Diagnosis not present

## 2023-10-20 DIAGNOSIS — M5431 Sciatica, right side: Secondary | ICD-10-CM | POA: Diagnosis not present

## 2023-10-20 DIAGNOSIS — M9903 Segmental and somatic dysfunction of lumbar region: Secondary | ICD-10-CM | POA: Diagnosis not present

## 2023-10-27 DIAGNOSIS — F4312 Post-traumatic stress disorder, chronic: Secondary | ICD-10-CM | POA: Diagnosis not present

## 2023-11-09 DIAGNOSIS — F4312 Post-traumatic stress disorder, chronic: Secondary | ICD-10-CM | POA: Diagnosis not present

## 2023-11-11 DIAGNOSIS — M9903 Segmental and somatic dysfunction of lumbar region: Secondary | ICD-10-CM | POA: Diagnosis not present

## 2023-11-11 DIAGNOSIS — M9906 Segmental and somatic dysfunction of lower extremity: Secondary | ICD-10-CM | POA: Diagnosis not present

## 2023-11-11 DIAGNOSIS — M9904 Segmental and somatic dysfunction of sacral region: Secondary | ICD-10-CM | POA: Diagnosis not present

## 2023-11-11 DIAGNOSIS — M5431 Sciatica, right side: Secondary | ICD-10-CM | POA: Diagnosis not present

## 2023-11-11 DIAGNOSIS — M9905 Segmental and somatic dysfunction of pelvic region: Secondary | ICD-10-CM | POA: Diagnosis not present

## 2023-11-16 ENCOUNTER — Ambulatory Visit
Admission: RE | Admit: 2023-11-16 | Discharge: 2023-11-16 | Disposition: A | Discharge status: Home / Self Care | Source: Ambulatory Visit | Attending: Nurse Practitioner | Admitting: Nurse Practitioner

## 2023-11-16 ENCOUNTER — Other Ambulatory Visit: Payer: Self-pay | Admitting: Nurse Practitioner

## 2023-11-16 DIAGNOSIS — Z1231 Encounter for screening mammogram for malignant neoplasm of breast: Secondary | ICD-10-CM

## 2023-11-21 ENCOUNTER — Ambulatory Visit: Payer: BC Managed Care – PPO | Admitting: Nurse Practitioner

## 2023-11-21 ENCOUNTER — Encounter: Payer: Self-pay | Admitting: Nurse Practitioner

## 2023-11-28 DIAGNOSIS — M9905 Segmental and somatic dysfunction of pelvic region: Secondary | ICD-10-CM | POA: Diagnosis not present

## 2023-11-28 DIAGNOSIS — M9906 Segmental and somatic dysfunction of lower extremity: Secondary | ICD-10-CM | POA: Diagnosis not present

## 2023-11-28 DIAGNOSIS — M9904 Segmental and somatic dysfunction of sacral region: Secondary | ICD-10-CM | POA: Diagnosis not present

## 2023-11-28 DIAGNOSIS — M5431 Sciatica, right side: Secondary | ICD-10-CM | POA: Diagnosis not present

## 2023-11-28 DIAGNOSIS — M9903 Segmental and somatic dysfunction of lumbar region: Secondary | ICD-10-CM | POA: Diagnosis not present

## 2023-12-09 DIAGNOSIS — F4312 Post-traumatic stress disorder, chronic: Secondary | ICD-10-CM | POA: Diagnosis not present

## 2023-12-13 DIAGNOSIS — L218 Other seborrheic dermatitis: Secondary | ICD-10-CM | POA: Diagnosis not present

## 2023-12-13 DIAGNOSIS — Z872 Personal history of diseases of the skin and subcutaneous tissue: Secondary | ICD-10-CM | POA: Diagnosis not present

## 2023-12-19 DIAGNOSIS — M9903 Segmental and somatic dysfunction of lumbar region: Secondary | ICD-10-CM | POA: Diagnosis not present

## 2023-12-19 DIAGNOSIS — M9905 Segmental and somatic dysfunction of pelvic region: Secondary | ICD-10-CM | POA: Diagnosis not present

## 2023-12-19 DIAGNOSIS — M5431 Sciatica, right side: Secondary | ICD-10-CM | POA: Diagnosis not present

## 2023-12-19 DIAGNOSIS — M9904 Segmental and somatic dysfunction of sacral region: Secondary | ICD-10-CM | POA: Diagnosis not present

## 2023-12-26 DIAGNOSIS — F4312 Post-traumatic stress disorder, chronic: Secondary | ICD-10-CM | POA: Diagnosis not present

## 2024-01-11 DIAGNOSIS — F4312 Post-traumatic stress disorder, chronic: Secondary | ICD-10-CM | POA: Diagnosis not present

## 2024-01-12 DIAGNOSIS — M9905 Segmental and somatic dysfunction of pelvic region: Secondary | ICD-10-CM | POA: Diagnosis not present

## 2024-01-12 DIAGNOSIS — M9904 Segmental and somatic dysfunction of sacral region: Secondary | ICD-10-CM | POA: Diagnosis not present

## 2024-01-12 DIAGNOSIS — M5431 Sciatica, right side: Secondary | ICD-10-CM | POA: Diagnosis not present

## 2024-01-12 DIAGNOSIS — M9903 Segmental and somatic dysfunction of lumbar region: Secondary | ICD-10-CM | POA: Diagnosis not present

## 2024-01-27 DIAGNOSIS — F4312 Post-traumatic stress disorder, chronic: Secondary | ICD-10-CM | POA: Diagnosis not present

## 2024-02-20 DIAGNOSIS — F411 Generalized anxiety disorder: Secondary | ICD-10-CM | POA: Diagnosis not present

## 2024-02-20 DIAGNOSIS — E559 Vitamin D deficiency, unspecified: Secondary | ICD-10-CM | POA: Diagnosis not present

## 2024-02-20 DIAGNOSIS — F4312 Post-traumatic stress disorder, chronic: Secondary | ICD-10-CM | POA: Diagnosis not present

## 2024-02-20 DIAGNOSIS — R635 Abnormal weight gain: Secondary | ICD-10-CM | POA: Diagnosis not present

## 2024-02-20 DIAGNOSIS — Z6827 Body mass index (BMI) 27.0-27.9, adult: Secondary | ICD-10-CM | POA: Diagnosis not present

## 2024-02-20 DIAGNOSIS — Z0000 Encounter for general adult medical examination without abnormal findings: Secondary | ICD-10-CM | POA: Diagnosis not present

## 2024-02-20 DIAGNOSIS — R200 Anesthesia of skin: Secondary | ICD-10-CM | POA: Diagnosis not present

## 2024-03-19 DIAGNOSIS — F4312 Post-traumatic stress disorder, chronic: Secondary | ICD-10-CM | POA: Diagnosis not present

## 2024-03-21 DIAGNOSIS — Z01419 Encounter for gynecological examination (general) (routine) without abnormal findings: Secondary | ICD-10-CM | POA: Diagnosis not present

## 2024-03-21 DIAGNOSIS — N870 Mild cervical dysplasia: Secondary | ICD-10-CM | POA: Diagnosis not present

## 2024-03-21 DIAGNOSIS — Z789 Other specified health status: Secondary | ICD-10-CM | POA: Diagnosis not present

## 2024-03-21 DIAGNOSIS — Z113 Encounter for screening for infections with a predominantly sexual mode of transmission: Secondary | ICD-10-CM | POA: Diagnosis not present

## 2024-03-30 DIAGNOSIS — E663 Overweight: Secondary | ICD-10-CM | POA: Diagnosis not present

## 2024-03-30 DIAGNOSIS — F411 Generalized anxiety disorder: Secondary | ICD-10-CM | POA: Diagnosis not present

## 2024-06-01 DIAGNOSIS — F4312 Post-traumatic stress disorder, chronic: Secondary | ICD-10-CM | POA: Diagnosis not present

## 2024-06-24 ENCOUNTER — Ambulatory Visit

## 2024-07-11 DIAGNOSIS — F4312 Post-traumatic stress disorder, chronic: Secondary | ICD-10-CM | POA: Diagnosis not present
